# Patient Record
Sex: Male | Born: 1955 | Race: White | Hispanic: No | Marital: Single | Smoking: Never smoker
Health system: Southern US, Community
[De-identification: ages and names within clinical notes are randomized; demographics above are authoritative.]

## PROBLEM LIST (undated history)

## (undated) DIAGNOSIS — I10 Essential (primary) hypertension: Secondary | ICD-10-CM

## (undated) DIAGNOSIS — I499 Cardiac arrhythmia, unspecified: Secondary | ICD-10-CM

## (undated) DIAGNOSIS — I6502 Occlusion and stenosis of left vertebral artery: Secondary | ICD-10-CM

## (undated) DIAGNOSIS — I495 Sick sinus syndrome: Secondary | ICD-10-CM

## (undated) DIAGNOSIS — I219 Acute myocardial infarction, unspecified: Secondary | ICD-10-CM

## (undated) DIAGNOSIS — E785 Hyperlipidemia, unspecified: Secondary | ICD-10-CM

## (undated) DIAGNOSIS — M1990 Unspecified osteoarthritis, unspecified site: Secondary | ICD-10-CM

## (undated) DIAGNOSIS — I209 Angina pectoris, unspecified: Secondary | ICD-10-CM

## (undated) DIAGNOSIS — I4891 Unspecified atrial fibrillation: Secondary | ICD-10-CM

## (undated) DIAGNOSIS — I491 Atrial premature depolarization: Secondary | ICD-10-CM

## (undated) DIAGNOSIS — I2510 Atherosclerotic heart disease of native coronary artery without angina pectoris: Secondary | ICD-10-CM

## (undated) DIAGNOSIS — J189 Pneumonia, unspecified organism: Secondary | ICD-10-CM

## (undated) HISTORY — PX: ANGIOPLASTY: SHX39

## (undated) HISTORY — PX: CORONARY ANGIOPLASTY: SHX604

## (undated) HISTORY — PX: CARDIAC CATHETERIZATION: SHX172

## (undated) HISTORY — PX: HERNIA REPAIR: SHX51

---

## 1977-05-23 HISTORY — PX: KNEE ARTHROSCOPY: SUR90

## 2008-07-23 ENCOUNTER — Ambulatory Visit: Payer: Self-pay | Admitting: Family Medicine

## 2008-07-29 ENCOUNTER — Ambulatory Visit: Payer: Self-pay | Admitting: Family Medicine

## 2008-08-06 ENCOUNTER — Ambulatory Visit: Payer: Self-pay | Admitting: Family Medicine

## 2008-08-13 ENCOUNTER — Ambulatory Visit: Payer: Self-pay | Admitting: Family Medicine

## 2008-08-25 ENCOUNTER — Ambulatory Visit: Payer: Self-pay | Admitting: Specialist

## 2009-05-23 DIAGNOSIS — I219 Acute myocardial infarction, unspecified: Secondary | ICD-10-CM

## 2009-05-23 HISTORY — DX: Acute myocardial infarction, unspecified: I21.9

## 2009-07-30 IMAGING — CR DG CHEST 2V
1 series · 2 of 2 positions shown · non-contrast
Comparison: none

REASON FOR EXAM: sob, abnoral cxr cough
COMMENTS:

PROCEDURE:     MDR - MDR CHEST PA(OR AP) AND LATERAL  - August 25, 2008  [DATE]
RESULT:     Comparison: 08/13/2008

[Series 1: view not recorded · 0.17mm/px · 2 of 2 slices shown]
[im 1/2]
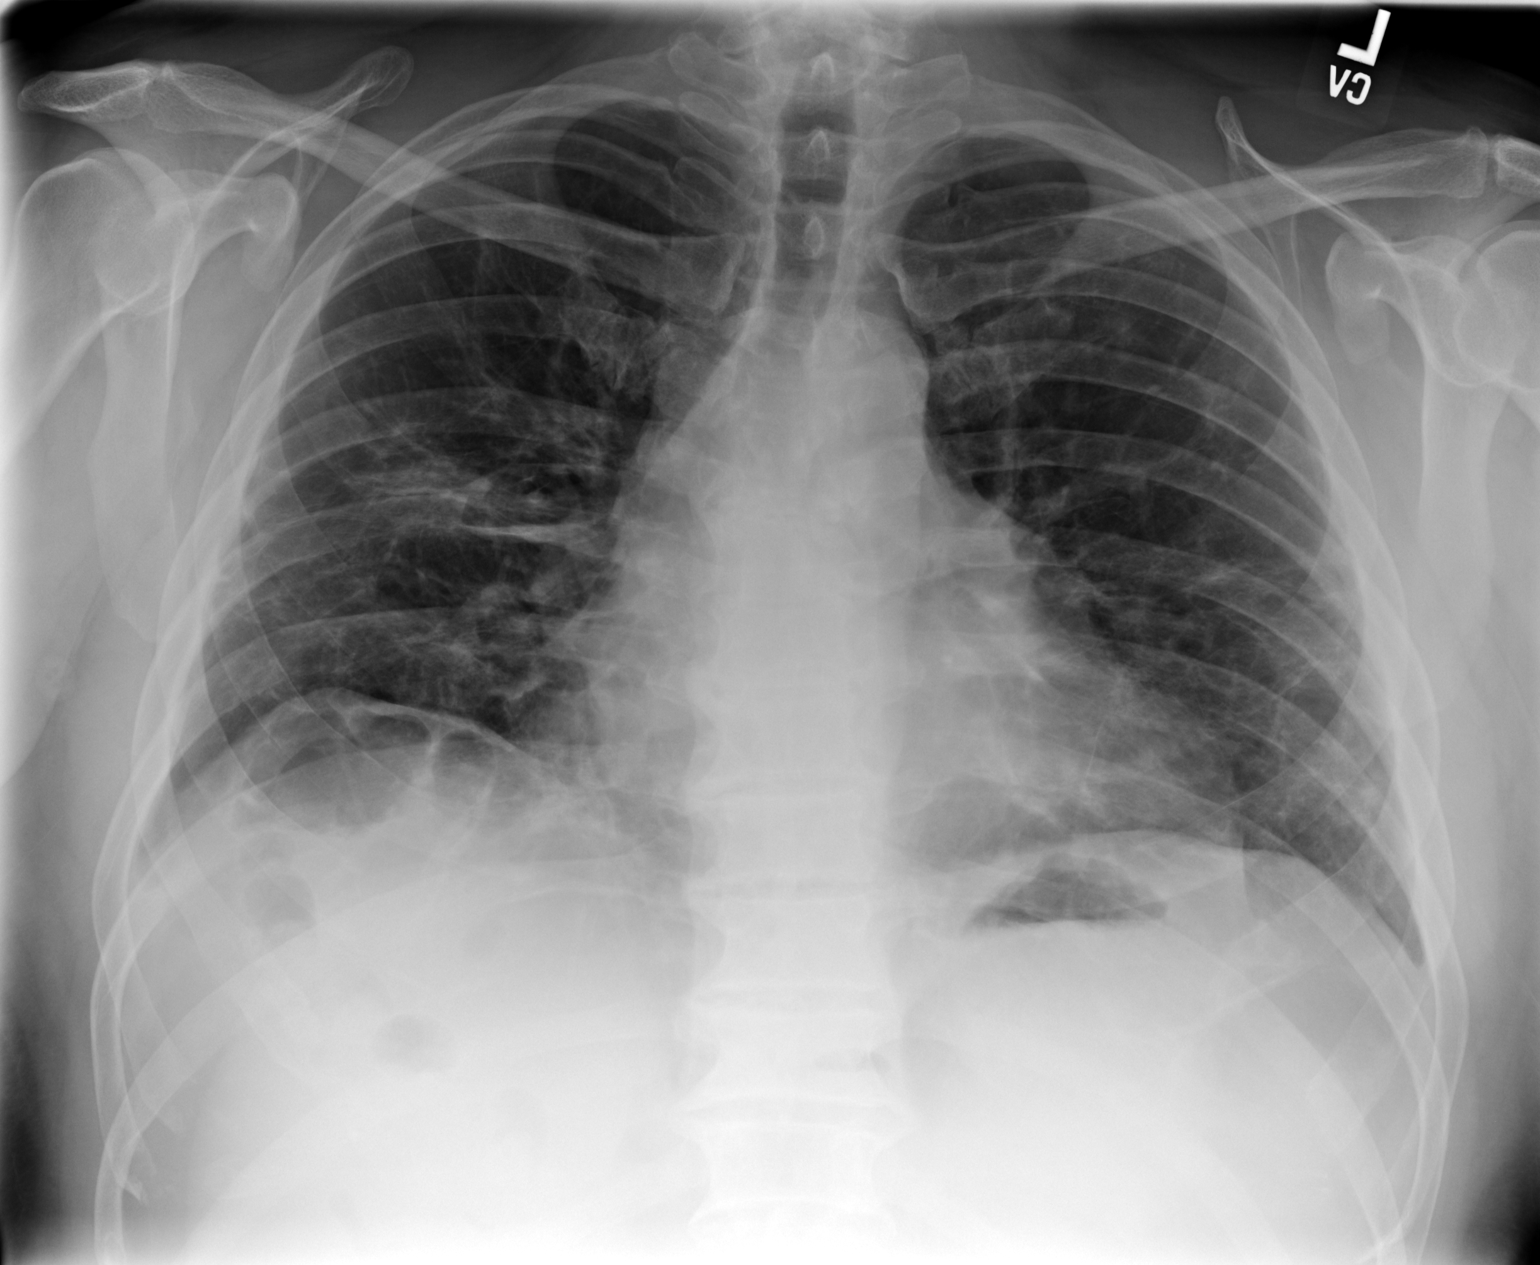
[im 2/2]
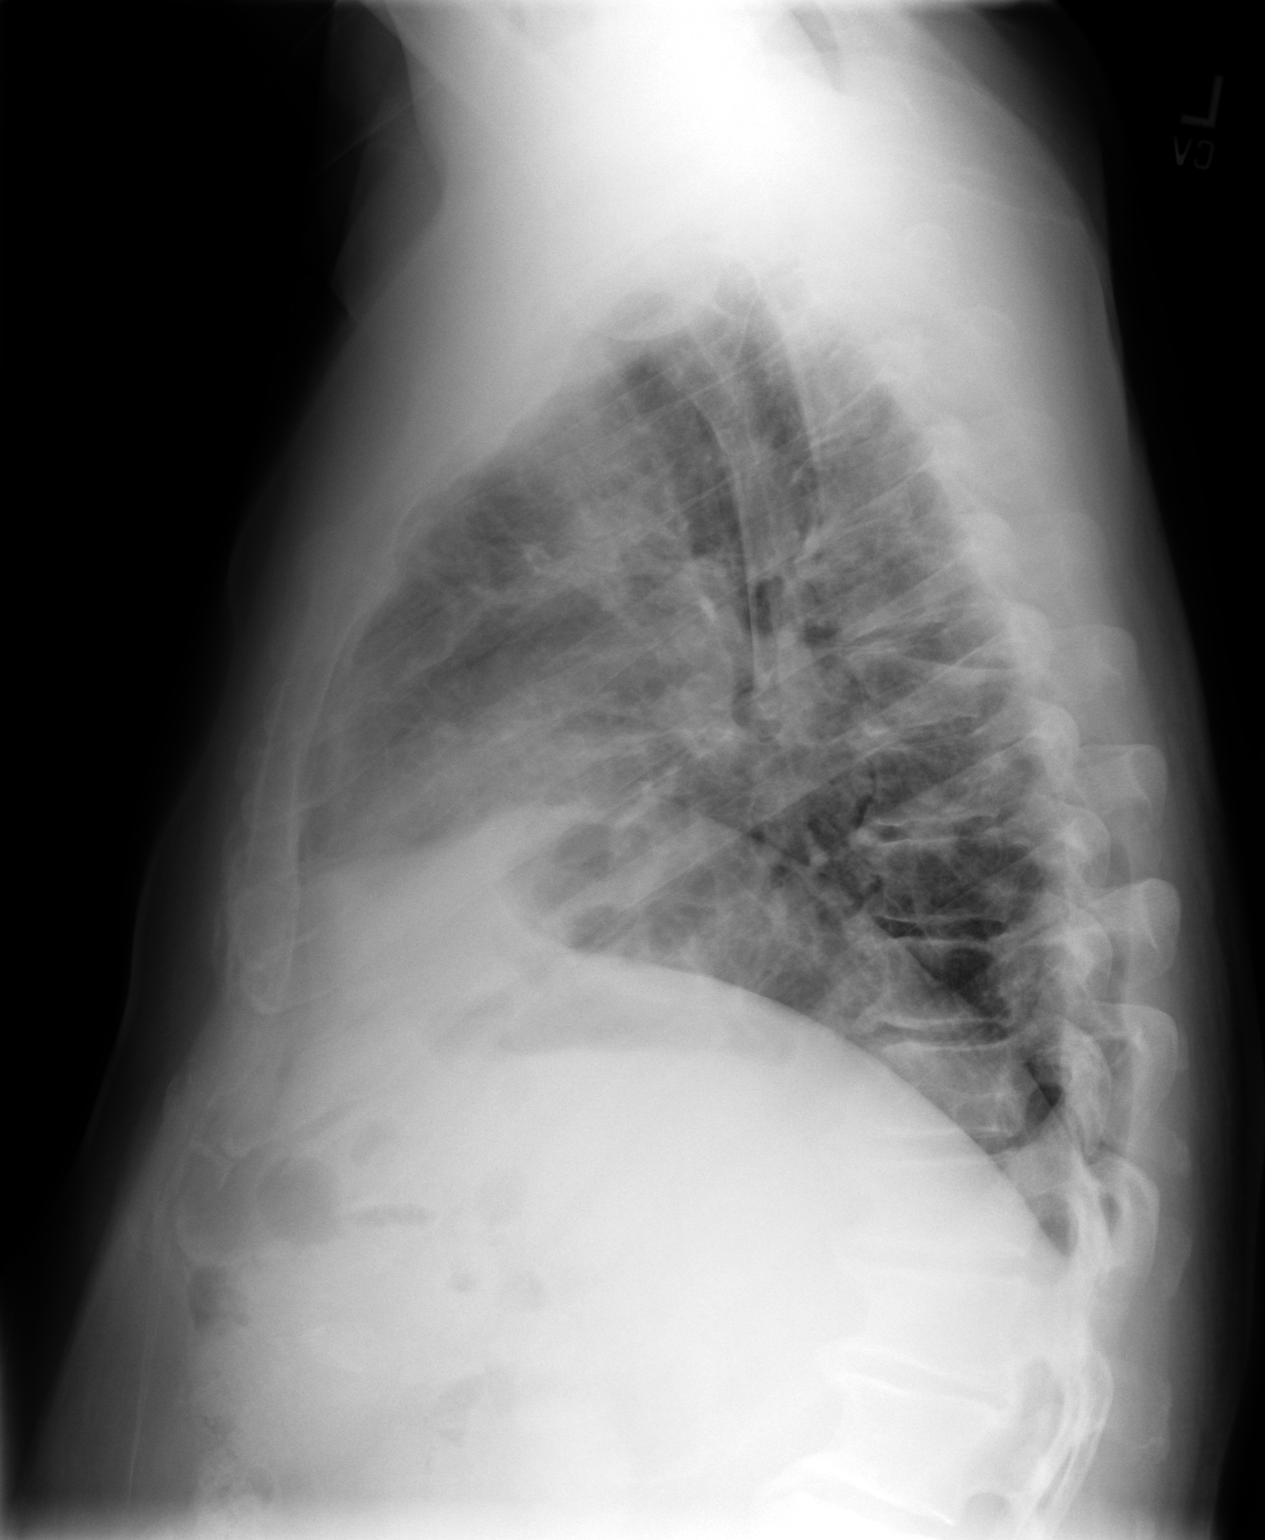

[2 of 2 positions shown; findings below may reference images not displayed]

FINDINGS: PA and lateral chest radiographs are provided. There are persistent patchy
densities seen bilaterally which appear mildly improved compared with the
prior examination which may resent resolving infiltrates versus atelectasis.
There is no pleural effusion or pneumothorax. The heart and mediastinum are
unremarkable. The osseous structures are unremarkable.
IMPRESSION: No acute disease of the chest.

## 2010-05-10 ENCOUNTER — Ambulatory Visit: Payer: Self-pay | Admitting: Internal Medicine

## 2011-09-07 ENCOUNTER — Ambulatory Visit: Payer: Self-pay | Admitting: Internal Medicine

## 2011-09-07 LAB — AMYLASE: Amylase: 50 U/L (ref 25–115)

## 2011-09-07 LAB — CBC WITH DIFFERENTIAL/PLATELET
Basophil #: 0 10*3/uL (ref 0.0–0.1)
Basophil %: 0.5 %
HCT: 51.2 % (ref 40.0–52.0)
HGB: 17.6 g/dL (ref 13.0–18.0)
Lymphocyte #: 1.6 10*3/uL (ref 1.0–3.6)
MCH: 31.3 pg (ref 26.0–34.0)
Monocyte %: 7.1 %
Neutrophil #: 6.4 10*3/uL (ref 1.4–6.5)
Neutrophil %: 73.1 %
Platelet: 223 10*3/uL (ref 150–440)
RDW: 13.7 % (ref 11.5–14.5)
WBC: 8.7 10*3/uL (ref 3.8–10.6)

## 2011-09-07 LAB — COMPREHENSIVE METABOLIC PANEL
Albumin: 4.3 g/dL (ref 3.4–5.0)
Anion Gap: 7 (ref 7–16)
BUN: 11 mg/dL (ref 7–18)
Bilirubin,Total: 0.7 mg/dL (ref 0.2–1.0)
Calcium, Total: 9.6 mg/dL (ref 8.5–10.1)
Co2: 32 mmol/L (ref 21–32)
Creatinine: 1.25 mg/dL (ref 0.60–1.30)
EGFR (Non-African Amer.): >60
Sodium: 140 mmol/L (ref 136–145)

## 2011-09-07 LAB — URINALYSIS, COMPLETE
Bacteria: NEGATIVE
Ketone: NEGATIVE
Leukocyte Esterase: NEGATIVE
Nitrite: NEGATIVE
Ph: 5 (ref 4.5–8.0)
Specific Gravity: 1.005 (ref 1.003–1.030)

## 2011-09-13 ENCOUNTER — Ambulatory Visit: Payer: Self-pay | Admitting: Internal Medicine

## 2012-04-24 ENCOUNTER — Ambulatory Visit: Payer: Self-pay

## 2015-07-07 DIAGNOSIS — R55 Syncope and collapse: Secondary | ICD-10-CM | POA: Diagnosis not present

## 2015-07-07 DIAGNOSIS — I2510 Atherosclerotic heart disease of native coronary artery without angina pectoris: Secondary | ICD-10-CM | POA: Diagnosis not present

## 2015-07-07 DIAGNOSIS — E782 Mixed hyperlipidemia: Secondary | ICD-10-CM | POA: Diagnosis not present

## 2015-07-07 DIAGNOSIS — R001 Bradycardia, unspecified: Secondary | ICD-10-CM | POA: Diagnosis not present

## 2015-07-13 DIAGNOSIS — D294 Benign neoplasm of scrotum: Secondary | ICD-10-CM | POA: Diagnosis not present

## 2015-07-19 DIAGNOSIS — R001 Bradycardia, unspecified: Secondary | ICD-10-CM | POA: Diagnosis not present

## 2015-07-19 DIAGNOSIS — R55 Syncope and collapse: Secondary | ICD-10-CM | POA: Diagnosis not present

## 2015-07-20 DIAGNOSIS — R55 Syncope and collapse: Secondary | ICD-10-CM | POA: Diagnosis not present

## 2015-07-21 DIAGNOSIS — S76312A Strain of muscle, fascia and tendon of the posterior muscle group at thigh level, left thigh, initial encounter: Secondary | ICD-10-CM | POA: Diagnosis not present

## 2015-08-10 DIAGNOSIS — E782 Mixed hyperlipidemia: Secondary | ICD-10-CM | POA: Diagnosis not present

## 2015-08-10 DIAGNOSIS — I2510 Atherosclerotic heart disease of native coronary artery without angina pectoris: Secondary | ICD-10-CM | POA: Diagnosis not present

## 2015-08-10 DIAGNOSIS — Z79899 Other long term (current) drug therapy: Secondary | ICD-10-CM | POA: Diagnosis not present

## 2015-08-25 DIAGNOSIS — R05 Cough: Secondary | ICD-10-CM | POA: Diagnosis not present

## 2015-08-25 DIAGNOSIS — R0982 Postnasal drip: Secondary | ICD-10-CM | POA: Diagnosis not present

## 2015-09-15 DIAGNOSIS — I491 Atrial premature depolarization: Secondary | ICD-10-CM | POA: Diagnosis not present

## 2015-09-15 DIAGNOSIS — I2510 Atherosclerotic heart disease of native coronary artery without angina pectoris: Secondary | ICD-10-CM | POA: Diagnosis not present

## 2015-09-15 DIAGNOSIS — E782 Mixed hyperlipidemia: Secondary | ICD-10-CM | POA: Diagnosis not present

## 2015-12-29 DIAGNOSIS — I209 Angina pectoris, unspecified: Secondary | ICD-10-CM | POA: Diagnosis not present

## 2015-12-29 DIAGNOSIS — I25119 Atherosclerotic heart disease of native coronary artery with unspecified angina pectoris: Secondary | ICD-10-CM | POA: Diagnosis not present

## 2016-01-04 DIAGNOSIS — I252 Old myocardial infarction: Secondary | ICD-10-CM | POA: Diagnosis not present

## 2016-01-04 DIAGNOSIS — E785 Hyperlipidemia, unspecified: Secondary | ICD-10-CM | POA: Diagnosis not present

## 2016-01-04 DIAGNOSIS — I10 Essential (primary) hypertension: Secondary | ICD-10-CM | POA: Diagnosis not present

## 2016-01-04 DIAGNOSIS — I2510 Atherosclerotic heart disease of native coronary artery without angina pectoris: Secondary | ICD-10-CM | POA: Diagnosis not present

## 2016-01-04 DIAGNOSIS — Z955 Presence of coronary angioplasty implant and graft: Secondary | ICD-10-CM | POA: Diagnosis not present

## 2016-01-04 DIAGNOSIS — R072 Precordial pain: Secondary | ICD-10-CM | POA: Diagnosis not present

## 2016-01-26 DIAGNOSIS — I2510 Atherosclerotic heart disease of native coronary artery without angina pectoris: Secondary | ICD-10-CM | POA: Diagnosis not present

## 2016-01-26 DIAGNOSIS — E782 Mixed hyperlipidemia: Secondary | ICD-10-CM | POA: Diagnosis not present

## 2016-02-10 DIAGNOSIS — Z79899 Other long term (current) drug therapy: Secondary | ICD-10-CM | POA: Diagnosis not present

## 2016-02-10 DIAGNOSIS — E782 Mixed hyperlipidemia: Secondary | ICD-10-CM | POA: Diagnosis not present

## 2016-02-10 DIAGNOSIS — Z0000 Encounter for general adult medical examination without abnormal findings: Secondary | ICD-10-CM | POA: Diagnosis not present

## 2016-02-10 DIAGNOSIS — I2510 Atherosclerotic heart disease of native coronary artery without angina pectoris: Secondary | ICD-10-CM | POA: Diagnosis not present

## 2016-02-10 DIAGNOSIS — Z1159 Encounter for screening for other viral diseases: Secondary | ICD-10-CM | POA: Diagnosis not present

## 2016-02-10 DIAGNOSIS — Z23 Encounter for immunization: Secondary | ICD-10-CM | POA: Diagnosis not present

## 2016-02-10 DIAGNOSIS — S39012D Strain of muscle, fascia and tendon of lower back, subsequent encounter: Secondary | ICD-10-CM | POA: Diagnosis not present

## 2016-02-10 DIAGNOSIS — Z125 Encounter for screening for malignant neoplasm of prostate: Secondary | ICD-10-CM | POA: Diagnosis not present

## 2016-02-10 DIAGNOSIS — L03211 Cellulitis of face: Secondary | ICD-10-CM | POA: Diagnosis not present

## 2016-04-03 DIAGNOSIS — S0990XA Unspecified injury of head, initial encounter: Secondary | ICD-10-CM | POA: Diagnosis not present

## 2016-04-03 DIAGNOSIS — Z23 Encounter for immunization: Secondary | ICD-10-CM | POA: Diagnosis not present

## 2016-04-03 DIAGNOSIS — R42 Dizziness and giddiness: Secondary | ICD-10-CM | POA: Diagnosis not present

## 2016-04-03 DIAGNOSIS — R55 Syncope and collapse: Secondary | ICD-10-CM | POA: Diagnosis not present

## 2016-04-03 DIAGNOSIS — S0181XA Laceration without foreign body of other part of head, initial encounter: Secondary | ICD-10-CM | POA: Diagnosis not present

## 2016-04-03 DIAGNOSIS — Z5181 Encounter for therapeutic drug level monitoring: Secondary | ICD-10-CM | POA: Diagnosis not present

## 2016-04-03 DIAGNOSIS — W01198A Fall on same level from slipping, tripping and stumbling with subsequent striking against other object, initial encounter: Secondary | ICD-10-CM | POA: Diagnosis not present

## 2016-04-03 DIAGNOSIS — S0101XA Laceration without foreign body of scalp, initial encounter: Secondary | ICD-10-CM | POA: Diagnosis not present

## 2016-04-03 DIAGNOSIS — I491 Atrial premature depolarization: Secondary | ICD-10-CM | POA: Diagnosis not present

## 2016-04-03 DIAGNOSIS — Z87891 Personal history of nicotine dependence: Secondary | ICD-10-CM | POA: Diagnosis not present

## 2016-04-03 DIAGNOSIS — R918 Other nonspecific abnormal finding of lung field: Secondary | ICD-10-CM | POA: Diagnosis not present

## 2016-04-03 DIAGNOSIS — R9431 Abnormal electrocardiogram [ECG] [EKG]: Secondary | ICD-10-CM | POA: Diagnosis not present

## 2016-04-03 DIAGNOSIS — W19XXXA Unspecified fall, initial encounter: Secondary | ICD-10-CM | POA: Diagnosis not present

## 2016-04-03 DIAGNOSIS — Z7901 Long term (current) use of anticoagulants: Secondary | ICD-10-CM | POA: Diagnosis not present

## 2016-04-03 DIAGNOSIS — Z7902 Long term (current) use of antithrombotics/antiplatelets: Secondary | ICD-10-CM | POA: Diagnosis not present

## 2016-04-03 DIAGNOSIS — S199XXA Unspecified injury of neck, initial encounter: Secondary | ICD-10-CM | POA: Diagnosis not present

## 2016-04-03 DIAGNOSIS — K089 Disorder of teeth and supporting structures, unspecified: Secondary | ICD-10-CM | POA: Diagnosis not present

## 2016-04-04 DIAGNOSIS — R55 Syncope and collapse: Secondary | ICD-10-CM | POA: Diagnosis not present

## 2016-04-12 DIAGNOSIS — I471 Supraventricular tachycardia: Secondary | ICD-10-CM | POA: Diagnosis not present

## 2016-04-12 DIAGNOSIS — R55 Syncope and collapse: Secondary | ICD-10-CM | POA: Diagnosis not present

## 2016-04-13 DIAGNOSIS — R2689 Other abnormalities of gait and mobility: Secondary | ICD-10-CM | POA: Diagnosis not present

## 2016-04-13 DIAGNOSIS — Z4802 Encounter for removal of sutures: Secondary | ICD-10-CM | POA: Diagnosis not present

## 2016-07-04 DIAGNOSIS — M7989 Other specified soft tissue disorders: Secondary | ICD-10-CM | POA: Diagnosis not present

## 2016-07-04 DIAGNOSIS — M25571 Pain in right ankle and joints of right foot: Secondary | ICD-10-CM | POA: Diagnosis not present

## 2016-07-04 DIAGNOSIS — M25471 Effusion, right ankle: Secondary | ICD-10-CM | POA: Diagnosis not present

## 2016-08-15 DIAGNOSIS — E782 Mixed hyperlipidemia: Secondary | ICD-10-CM | POA: Diagnosis not present

## 2016-08-15 DIAGNOSIS — Z79899 Other long term (current) drug therapy: Secondary | ICD-10-CM | POA: Diagnosis not present

## 2016-08-15 DIAGNOSIS — I2510 Atherosclerotic heart disease of native coronary artery without angina pectoris: Secondary | ICD-10-CM | POA: Diagnosis not present

## 2016-09-22 DIAGNOSIS — I6502 Occlusion and stenosis of left vertebral artery: Secondary | ICD-10-CM | POA: Diagnosis not present

## 2016-09-22 DIAGNOSIS — I4891 Unspecified atrial fibrillation: Secondary | ICD-10-CM | POA: Diagnosis not present

## 2016-09-22 DIAGNOSIS — R55 Syncope and collapse: Secondary | ICD-10-CM | POA: Diagnosis not present

## 2016-09-29 DIAGNOSIS — I2510 Atherosclerotic heart disease of native coronary artery without angina pectoris: Secondary | ICD-10-CM | POA: Diagnosis not present

## 2016-09-29 DIAGNOSIS — I4891 Unspecified atrial fibrillation: Secondary | ICD-10-CM | POA: Diagnosis not present

## 2016-09-29 DIAGNOSIS — E782 Mixed hyperlipidemia: Secondary | ICD-10-CM | POA: Diagnosis not present

## 2016-10-13 DIAGNOSIS — I491 Atrial premature depolarization: Secondary | ICD-10-CM | POA: Diagnosis not present

## 2016-10-13 DIAGNOSIS — I4891 Unspecified atrial fibrillation: Secondary | ICD-10-CM | POA: Diagnosis not present

## 2016-10-19 ENCOUNTER — Ambulatory Visit
Admission: EM | Admit: 2016-10-19 | Discharge: 2016-10-19 | Disposition: A | Discharge status: Home / Self Care | Payer: PPO | Attending: Family Medicine | Admitting: Family Medicine

## 2016-10-19 DIAGNOSIS — S61213A Laceration without foreign body of left middle finger without damage to nail, initial encounter: Secondary | ICD-10-CM

## 2016-10-19 HISTORY — DX: Occlusion and stenosis of left vertebral artery: I65.02

## 2016-10-19 HISTORY — DX: Sick sinus syndrome: I49.5

## 2016-10-19 HISTORY — DX: Hyperlipidemia, unspecified: E78.5

## 2016-10-19 HISTORY — DX: Unspecified atrial fibrillation: I48.91

## 2016-10-19 HISTORY — DX: Angina pectoris, unspecified: I20.9

## 2016-10-19 HISTORY — DX: Atrial premature depolarization: I49.1

## 2016-10-19 MED ORDER — LIDOCAINE HCL (PF) 1 % IJ SOLN: 5.0000 mL | Freq: Once | INTRAMUSCULAR | Status: DC

## 2016-10-19 MED ORDER — ACETAMINOPHEN 325 MG PO TABS
650.0000 mg | ORAL_TABLET | Freq: Once | ORAL | Status: AC
  Administered 2016-10-19: 650 mg via ORAL

## 2016-10-19 MED ORDER — CEPHALEXIN 500 MG PO CAPS: 500.0000 mg | ORAL_CAPSULE | Freq: Four times a day (QID) | ORAL | 0 refills | Status: DC

## 2016-10-19 MED ORDER — MUPIROCIN 2 % EX OINT: TOPICAL_OINTMENT | CUTANEOUS | 0 refills | Status: DC

## 2016-10-19 NOTE — Discharge Instructions (Signed)
Take medication as prescribed. Keep clean and dry as discussed.   Return in 7-10 days for suture removal.   Follow up with your primary care physician this week as needed. Return to Urgent care for new or worsening concerns.

## 2016-10-19 NOTE — ED Provider Notes (Signed)
[facility] ____________________________________________  Time seen: Approximately 8:05 PM  I have reviewed the triage vital signs and the nursing notes.   HISTORY  Chief Complaint Laceration   HPI Stephen Erickson is a 61 y.o. male presents for evaluation of left third digit laceration. Patient reports approximately 1 hour prior to arrival he was working outside of his home. Patient states that he was using an electric hedge tremors and was trying to pull a plant, reports his hand slipped causing it to hit the head trimmer causing laceration. States no pain to hand at this time. States he contemplated not come in tonight, but came in due to the bleeding continued. Reports taking oral Eliquis. Denies decreased range of motion, paresthesias or other injury. Denies fall to the ground, head injury or loss of consciousness. Denies other injuries. Reports last tetanus immunization was in 2017. Reports otherwise feels well. Denies other complaints. Denies recent sickness. Denies recent antibiotic use.   [place]: PCP   Past Medical History:  Diagnosis Date  . Angina pectoris ([place])   . Atrial fibrillation ([place])   . Hyperlipidemia   . PAC (premature atrial contraction)   . Tachy-brady syndrome ([place])   . Vertebral artery occlusion, left     There are no active problems to display for this patient.   Past Surgical History:  Procedure Laterality Date  . ANGIOPLASTY    . CARDIAC CATHETERIZATION    . HERNIA REPAIR    . KNEE ARTHROSCOPY Left 1979      Current Facility-Administered Medications:  .  lidocaine (PF) (XYLOCAINE) 1 % injection 5 mL, 5 mL, Other, Once, Marylene Land, NP  Current Outpatient Prescriptions:  .  apixaban (ELIQUIS) 5 MG TABS tablet, Take 5 mg by mouth 2 (two) times daily., Disp: , Rfl:  .  aspirin 81 MG chewable tablet, Chew by mouth daily., Disp: , Rfl:  .  atorvastatin (LIPITOR) 80 MG tablet, Take 80 mg by mouth daily.,  Disp: , Rfl:  .  diltiazem (CARDIZEM LA) 120 MG 24 hr tablet, Take 120 mg by mouth daily., Disp: , Rfl:  .  metoprolol succinate (TOPROL-XL) 100 MG 24 hr tablet, Take 100 mg by mouth daily. Take with or immediately following a meal., Disp: , Rfl:  .  cephALEXin (KEFLEX) 500 MG capsule, Take 1 capsule (500 mg total) by mouth 4 (four) times daily., Disp: 20 capsule, Rfl: 0 .  mupirocin ointment (BACTROBAN) 2 %, Apply three times a day for 7 days., Disp: 22 g, Rfl: 0  Allergies Lisinopril  No family history on file.  Social History Social History  Substance Use Topics  . Smoking status: Never Smoker  . Smokeless tobacco: Former Systems developer  . Alcohol use No    Review of Systems Constitutional: No fever/chills Cardiovascular: Denies chest pain. Respiratory: Denies shortness of breath. Musculoskeletal: Negative for back pain. Skin: As above.   ____________________________________________   PHYSICAL EXAM:  VITAL SIGNS: ED Triage Vitals  Enc Vitals Group     BP 10/19/16 1941 (!) 156/78     Pulse Rate 10/19/16 1941 60     Resp 10/19/16 1941 16     Temp 10/19/16 1941 98 F (36.7 C)     Temp Source 10/19/16 1941 Oral     SpO2 10/19/16 1941 97 %     Weight 10/19/16 1938 192 lb (87.1 kg)     Height 10/19/16 1938 5\' 6"  (1.676 m)     Head Circumference --  Peak Flow --      Pain Score 10/19/16 1938 0     Pain Loc --      Pain Edu? --      Excl. in [place]? --     Constitutional: Alert and oriented. Well appearing and in no acute distress. Cardiovascular: Normal rate, regular rhythm. Grossly normal heart sounds.  Good peripheral circulation. Respiratory: Normal respiratory effort without tachypnea nor retractions. Breath sounds are clear and equal bilaterally. No wheezes, rales, rhonchi. Musculoskeletal: Ambulatory with steady gait. see skin below.  Neurologic:  Normal speech and language. Speech is normal. No gait instability.  Skin:  Skin is warm, dry. Except: Left third finger  lateral aspect of distal phalanx 3 cm flap laceration present, no nail involvement, mild active bleeding, no foreign body visualized, no tendon or bone visualize, left third digit minimally tenderness to laceration site, no point bony tenderness, full range of motion present, left third digit with normal distal capillary refill and sensation, left third digit with full resisted flexion and extension with good range of motion, no motor or tendon deficit noted, left hand otherwise nontender. Psychiatric: Mood and affect are normal. Speech and behavior are normal. Patient exhibits appropriate insight and judgment   ___________________________________________   LABS (all labs ordered are listed, but only abnormal results are displayed)  Labs Reviewed - No data to display ____________________________________________  RADIOLOGY  No results found.   Patient declined ____________________________________________   PROCEDURES Procedures   Procedure(s) performed:  Procedure explained and verbal consent obtained. Consent: Verbal consent obtained. Written consent not obtained. Risks and benefits: risks, benefits and alternatives were discussed Patient identity confirmed: verbally with patient and hospital-assigned identification number  Consent given by: patient   Laceration Repair Location: left 3 finger Length: 3cm Foreign bodies: no foreign bodies noted Tendon involvement: none Nerve involvement: none Preparation: Patient was prepped and draped in the usual sterile fashion. Anesthesia with 1% Lidocaine 3 mls Irrigation solution: saline and cleaned with betadine Irrigation method: jet lavage Amount of cleaning: copious Repaired with 4-0 nylon  Number of sutures: 5 Technique: simple interrupted  Approximation: loose Patient tolerate well. Wound well approximated post repair.  Antibiotic ointment and dressing applied.  Wound care instructions provided.  Observe for any signs of  infection or other problems.    ______________________________________   INITIAL IMPRESSION / ASSESSMENT AND PLAN / ED COURSE  Pertinent labs & imaging results that were available during my care of the patient were reviewed by me and considered in my medical decision making (see chart for details).  Very well-appearing patient. No acute distress. Presenting for left third digit laceration of obtained at just prior to arrival by a mechanical injury. Tetanus immunization is up-to-date. Counseled regarding obtain an x-ray to evaluate for any bony abnormality as well as foreign body, patient declines. Counseled regarding the x-ray, patient verbalized understanding of risks and declines x-ray. Wound copiously irrigated and repaired. Discussed patient with patient as dirty outdoor tool object recommend placing patient on oral Keflex and topical Bactroban. Encouraged monitoring closely. Discussed wound care. Discussed return to urgent care in 7-10 days for suture removal. Discussed sooner return parameters.Discussed indication, risks and benefits of medications with patient.  Discussed follow up with Primary care physician this week as needed. Discussed follow up and return parameters including no resolution or any worsening concerns. Patient verbalized understanding and agreed to plan.   ____________________________________________   FINAL CLINICAL IMPRESSION(S) / ED DIAGNOSES  Final diagnoses:  Laceration of left middle finger  without foreign body without damage to nail, initial encounter     Discharge Medication List as of 10/19/2016  8:30 PM    START taking these medications   Details  cephALEXin (KEFLEX) 500 MG capsule Take 1 capsule (500 mg total) by mouth 4 (four) times daily., Starting Wed 10/19/2016, Normal    mupirocin ointment (BACTROBAN) 2 % Apply three times a day for 7 days., Normal        Note: This dictation was prepared with Dragon dictation along with smaller phrase  technology. Any transcriptional errors that result from this process are unintentional.         Marylene Land, NP 10/19/16 2113

## 2016-10-19 NOTE — ED Triage Notes (Signed)
Patient complains of laceration to left middle finger around 630pm with a hedge trimmer.

## 2016-10-21 DIAGNOSIS — R55 Syncope and collapse: Secondary | ICD-10-CM | POA: Diagnosis not present

## 2016-10-26 ENCOUNTER — Ambulatory Visit
Admission: EM | Admit: 2016-10-26 | Discharge: 2016-10-26 | Disposition: A | Discharge status: Home / Self Care | Payer: PPO

## 2016-10-26 DIAGNOSIS — I480 Paroxysmal atrial fibrillation: Secondary | ICD-10-CM | POA: Diagnosis not present

## 2016-10-26 DIAGNOSIS — I471 Supraventricular tachycardia: Secondary | ICD-10-CM | POA: Diagnosis not present

## 2016-10-26 DIAGNOSIS — Z4802 Encounter for removal of sutures: Secondary | ICD-10-CM

## 2016-10-26 DIAGNOSIS — R55 Syncope and collapse: Secondary | ICD-10-CM | POA: Diagnosis not present

## 2016-10-26 NOTE — ED Triage Notes (Signed)
Here for suture removal from left middle finger. Site appears well healed.

## 2016-10-28 DIAGNOSIS — I2510 Atherosclerotic heart disease of native coronary artery without angina pectoris: Secondary | ICD-10-CM | POA: Diagnosis not present

## 2016-10-28 DIAGNOSIS — I6502 Occlusion and stenosis of left vertebral artery: Secondary | ICD-10-CM | POA: Diagnosis not present

## 2016-10-28 DIAGNOSIS — E782 Mixed hyperlipidemia: Secondary | ICD-10-CM | POA: Diagnosis not present

## 2016-10-28 DIAGNOSIS — I209 Angina pectoris, unspecified: Secondary | ICD-10-CM | POA: Diagnosis not present

## 2016-10-28 DIAGNOSIS — I472 Ventricular tachycardia: Secondary | ICD-10-CM | POA: Diagnosis not present

## 2016-10-28 DIAGNOSIS — I491 Atrial premature depolarization: Secondary | ICD-10-CM | POA: Diagnosis not present

## 2016-10-28 DIAGNOSIS — I480 Paroxysmal atrial fibrillation: Secondary | ICD-10-CM | POA: Diagnosis not present

## 2016-10-28 DIAGNOSIS — I495 Sick sinus syndrome: Secondary | ICD-10-CM | POA: Diagnosis not present

## 2016-10-28 DIAGNOSIS — R55 Syncope and collapse: Secondary | ICD-10-CM | POA: Diagnosis not present

## 2016-10-28 DIAGNOSIS — I214 Non-ST elevation (NSTEMI) myocardial infarction: Secondary | ICD-10-CM | POA: Diagnosis not present

## 2016-10-28 DIAGNOSIS — I471 Supraventricular tachycardia: Secondary | ICD-10-CM | POA: Diagnosis not present

## 2016-11-04 DIAGNOSIS — I471 Supraventricular tachycardia: Secondary | ICD-10-CM | POA: Diagnosis not present

## 2016-11-04 DIAGNOSIS — R55 Syncope and collapse: Secondary | ICD-10-CM | POA: Diagnosis not present

## 2016-11-15 DIAGNOSIS — Z5181 Encounter for therapeutic drug level monitoring: Secondary | ICD-10-CM | POA: Diagnosis not present

## 2016-11-15 DIAGNOSIS — Z79899 Other long term (current) drug therapy: Secondary | ICD-10-CM | POA: Diagnosis not present

## 2017-01-19 DIAGNOSIS — I480 Paroxysmal atrial fibrillation: Secondary | ICD-10-CM | POA: Diagnosis not present

## 2017-02-15 DIAGNOSIS — Z23 Encounter for immunization: Secondary | ICD-10-CM | POA: Diagnosis not present

## 2017-02-15 DIAGNOSIS — E669 Obesity, unspecified: Secondary | ICD-10-CM | POA: Diagnosis not present

## 2017-02-15 DIAGNOSIS — Z0000 Encounter for general adult medical examination without abnormal findings: Secondary | ICD-10-CM | POA: Diagnosis not present

## 2017-02-15 DIAGNOSIS — I2510 Atherosclerotic heart disease of native coronary artery without angina pectoris: Secondary | ICD-10-CM | POA: Diagnosis not present

## 2017-02-15 DIAGNOSIS — E782 Mixed hyperlipidemia: Secondary | ICD-10-CM | POA: Diagnosis not present

## 2017-02-15 DIAGNOSIS — Z79899 Other long term (current) drug therapy: Secondary | ICD-10-CM | POA: Diagnosis not present

## 2017-02-24 DIAGNOSIS — I471 Supraventricular tachycardia: Secondary | ICD-10-CM | POA: Diagnosis not present

## 2017-02-24 DIAGNOSIS — I2510 Atherosclerotic heart disease of native coronary artery without angina pectoris: Secondary | ICD-10-CM | POA: Diagnosis not present

## 2017-02-24 DIAGNOSIS — E782 Mixed hyperlipidemia: Secondary | ICD-10-CM | POA: Diagnosis not present

## 2017-03-09 DIAGNOSIS — M170 Bilateral primary osteoarthritis of knee: Secondary | ICD-10-CM | POA: Diagnosis not present

## 2017-03-09 DIAGNOSIS — M25561 Pain in right knee: Secondary | ICD-10-CM | POA: Diagnosis not present

## 2017-03-09 DIAGNOSIS — M25562 Pain in left knee: Secondary | ICD-10-CM | POA: Diagnosis not present

## 2017-07-20 DIAGNOSIS — I480 Paroxysmal atrial fibrillation: Secondary | ICD-10-CM | POA: Diagnosis not present

## 2017-08-17 DIAGNOSIS — E669 Obesity, unspecified: Secondary | ICD-10-CM | POA: Diagnosis not present

## 2017-08-17 DIAGNOSIS — Z0000 Encounter for general adult medical examination without abnormal findings: Secondary | ICD-10-CM | POA: Diagnosis not present

## 2017-08-17 DIAGNOSIS — E782 Mixed hyperlipidemia: Secondary | ICD-10-CM | POA: Diagnosis not present

## 2017-08-17 DIAGNOSIS — Z79899 Other long term (current) drug therapy: Secondary | ICD-10-CM | POA: Diagnosis not present

## 2017-08-17 DIAGNOSIS — M170 Bilateral primary osteoarthritis of knee: Secondary | ICD-10-CM | POA: Diagnosis not present

## 2017-08-17 DIAGNOSIS — I2510 Atherosclerotic heart disease of native coronary artery without angina pectoris: Secondary | ICD-10-CM | POA: Diagnosis not present

## 2017-08-22 DIAGNOSIS — M25562 Pain in left knee: Secondary | ICD-10-CM | POA: Diagnosis not present

## 2017-08-22 DIAGNOSIS — M25561 Pain in right knee: Secondary | ICD-10-CM | POA: Diagnosis not present

## 2017-08-22 DIAGNOSIS — M170 Bilateral primary osteoarthritis of knee: Secondary | ICD-10-CM | POA: Diagnosis not present

## 2017-09-19 DIAGNOSIS — E782 Mixed hyperlipidemia: Secondary | ICD-10-CM | POA: Diagnosis not present

## 2017-09-19 DIAGNOSIS — I2510 Atherosclerotic heart disease of native coronary artery without angina pectoris: Secondary | ICD-10-CM | POA: Diagnosis not present

## 2017-09-19 DIAGNOSIS — I471 Supraventricular tachycardia: Secondary | ICD-10-CM | POA: Diagnosis not present

## 2017-09-19 DIAGNOSIS — I480 Paroxysmal atrial fibrillation: Secondary | ICD-10-CM | POA: Diagnosis not present

## 2017-09-26 DIAGNOSIS — M170 Bilateral primary osteoarthritis of knee: Secondary | ICD-10-CM | POA: Diagnosis not present

## 2017-10-04 DIAGNOSIS — M1732 Unilateral post-traumatic osteoarthritis, left knee: Secondary | ICD-10-CM | POA: Diagnosis not present

## 2017-10-18 ENCOUNTER — Encounter
Admission: RE | Admit: 2017-10-18 | Discharge: 2017-10-18 | Disposition: A | Discharge status: Home / Self Care | Payer: PPO | Source: Ambulatory Visit | Attending: Surgery | Admitting: Surgery

## 2017-10-18 ENCOUNTER — Other Ambulatory Visit: Payer: Self-pay

## 2017-10-18 ENCOUNTER — Ambulatory Visit
Admission: RE | Admit: 2017-10-18 | Discharge: 2017-10-18 | Disposition: A | Discharge status: Home / Self Care | Payer: PPO | Source: Ambulatory Visit | Attending: Surgery | Admitting: Surgery

## 2017-10-18 DIAGNOSIS — Z01811 Encounter for preprocedural respiratory examination: Secondary | ICD-10-CM | POA: Diagnosis not present

## 2017-10-18 DIAGNOSIS — Z01810 Encounter for preprocedural cardiovascular examination: Secondary | ICD-10-CM | POA: Diagnosis not present

## 2017-10-18 DIAGNOSIS — R001 Bradycardia, unspecified: Secondary | ICD-10-CM | POA: Insufficient documentation

## 2017-10-18 DIAGNOSIS — M170 Bilateral primary osteoarthritis of knee: Secondary | ICD-10-CM | POA: Insufficient documentation

## 2017-10-18 DIAGNOSIS — J984 Other disorders of lung: Secondary | ICD-10-CM | POA: Diagnosis not present

## 2017-10-18 HISTORY — DX: Acute myocardial infarction, unspecified: I21.9

## 2017-10-18 LAB — URINALYSIS, ROUTINE W REFLEX MICROSCOPIC
Bilirubin Urine: NEGATIVE
GLUCOSE, UA: NEGATIVE mg/dL
HGB URINE DIPSTICK: NEGATIVE
Ketones, ur: NEGATIVE mg/dL
Leukocytes, UA: NEGATIVE
Nitrite: NEGATIVE
PROTEIN: NEGATIVE mg/dL
SPECIFIC GRAVITY, URINE: 1.018 (ref 1.005–1.030)
pH: 5 (ref 5.0–8.0)

## 2017-10-18 LAB — BASIC METABOLIC PANEL
Anion gap: 7 (ref 5–15)
BUN: 12 mg/dL (ref 6–20)
CO2: 27 mmol/L (ref 22–32)
CREATININE: 1.06 mg/dL (ref 0.61–1.24)
Calcium: 9.3 mg/dL (ref 8.9–10.3)
Chloride: 104 mmol/L (ref 101–111)
GFR calc non Af Amer: >60 mL/min (ref >60)
GLUCOSE: 84 mg/dL (ref 65–99)
Potassium: 3.7 mmol/L (ref 3.5–5.1)
Sodium: 138 mmol/L (ref 135–145)

## 2017-10-18 LAB — PROTIME-INR
INR: 0.93
Prothrombin Time: 12.4 seconds (ref 11.4–15.2)

## 2017-10-18 LAB — CBC
HEMATOCRIT: 51.9 % (ref 40.0–52.0)
Hemoglobin: 17.4 g/dL (ref 13.0–18.0)
MCH: 31.2 pg (ref 26.0–34.0)
MCHC: 33.6 g/dL (ref 32.0–36.0)
MCV: 92.8 fL (ref 80.0–100.0)
Platelets: 184 10*3/uL (ref 150–440)
RBC: 5.59 MIL/uL (ref 4.40–5.90)
RDW: 14.3 % (ref 11.5–14.5)
WBC: 5.6 10*3/uL (ref 3.8–10.6)

## 2017-10-18 LAB — SURGICAL PCR SCREEN
MRSA, PCR: NEGATIVE
Staphylococcus aureus: NEGATIVE

## 2017-10-18 LAB — TYPE AND SCREEN
ABO/RH(D): B POS
ANTIBODY SCREEN: NEGATIVE

## 2017-10-18 NOTE — Patient Instructions (Signed)
Your procedure is scheduled on: Tuesday 10/31/17 Report to [facility]. To find out your arrival time please call [phone] between 1PM - 3PM on Monday 10/30/17.  Remember: Instructions that are not followed completely may result in serious medical risk, up to and including death, or upon the discretion of your surgeon and anesthesiologist your surgery may need to be rescheduled.     _X__ 1. Do not eat food after midnight the night before your procedure.                 No gum chewing or hard candies. You may drink clear liquids up to 2 hours                 before you are scheduled to arrive for your surgery- DO not drink clear                 liquids within 2 hours of the start of your surgery.                 Clear Liquids include:  water, apple juice without pulp, clear carbohydrate                 drink such as Clearfast or Gatorade, Black Coffee or Tea (Do not add                 anything to coffee or tea).  __X__2.  On the morning of surgery brush your teeth with toothpaste and water, you                 may rinse your mouth with mouthwash if you wish.  Do not swallow any              toothpaste of mouthwash.     _X__ 3.  No Alcohol for 24 hours before or after surgery.   _X__ 4.  Do Not Smoke or use e-cigarettes For 24 Hours Prior to Your Surgery.                 Do not use any chewable tobacco products for at least 6 hours prior to                 surgery.  ____  5.  Bring all medications with you on the day of surgery if instructed.   __X__  6.  Notify your doctor if there is any change in your medical condition      (cold, fever, infections).     Do not wear jewelry, make-up, hairpins, clips or nail polish. Do not wear lotions, powders, or perfumes.  Do not shave 48 hours prior to surgery. Men may shave face and neck. Do not bring valuables to the hospital.    [facility] is not responsible for any belongings or  valuables.  Contacts, dentures/partials or body piercings may not be worn into surgery. Bring a case for your contacts, glasses or hearing aids, a denture cup will be supplied. Leave your suitcase in the car. After surgery it may be brought to your room. For patients admitted to the hospital, discharge time is determined by your treatment team.   Patients discharged the day of surgery will not be allowed to drive home.   Please read over the following fact sheets that you were given:   MRSA Information  __X__ Take these medicines the morning of surgery with A SIP OF WATER:  1. METOPROLOL  2. SOTALOL  3.   4.  5.  6.  ____ Fleet Enema (as directed)   __X__ Use CHG Soap/SAGE wipes as directed  ____ Use inhalers on the day of surgery  ____ Stop metformin/Janumet/Farxiga 2 days prior to surgery    ____ Take 1/2 of usual insulin dose the night before surgery. No insulin the morning          of surgery.   __X__ Stop Blood Thinners Coumadin/Plavix/Xarelto/Pleta/Pradaxa/Eliquis/Effient/Aspirin  on   Or contact your Surgeon, Cardiologist or Medical Doctor regarding  ability to stop your blood thinners  __X__ Stop Anti-inflammatories 7 days before surgery such as Advil, Ibuprofen, Motrin,  BC or Goodies Powder, Naprosyn, Naproxen, Aleve, Aspirin    __X__ Stop all herbal supplements, fish oil or vitamin E until after surgery.    ____ Bring C-Pap to the hospital.   YOU NEED TO CONTACT YOUR DOCTOR ABOUT THE ELIQUIS PRESCRIPTION THEN CALL OR COME BACK AND LET us KNOW WHAT HE/SHE ADVISED. THEY NEED TO BE AWARE YOUR HAVING KNEE REPLACEMENT SURGERY ON 10/31/17 AND IF YOU CAN HOLD THE ELIQUIS AND OR ASPIRIN AND FOR HOW LONG.

## 2017-10-18 NOTE — Pre-Procedure Instructions (Signed)
Reviewed patient medical history and most recent cardiology note with Dr Amie Critchley. During interview patient stated he was not taking Eliquis as recommended by his cardiologist due to its cost when he went to pick it up at his pharmacy. Cardiology clearance requested, fax sent to cardiologist office and Tiffany at ortho notified.

## 2017-10-24 NOTE — Pre-Procedure Instructions (Signed)
SPOKE WITH Stephen Erickson AT DR ROE[facility] AND SHE IS TRACKING STATUS OF CLEARANCE.

## 2017-10-30 MED ORDER — CEFAZOLIN SODIUM-DEXTROSE 2-4 GM/100ML-% IV SOLN
2.0000 g | Freq: Once | INTRAVENOUS | Status: AC
  Administered 2017-10-31: 2 g via INTRAVENOUS

## 2017-10-31 ENCOUNTER — Encounter: Payer: Self-pay | Admitting: *Deleted

## 2017-10-31 ENCOUNTER — Encounter
Admission: RE | Disposition: A | Discharge status: Home Health | Payer: Self-pay | Source: Home / Self Care | Attending: Surgery

## 2017-10-31 ENCOUNTER — Other Ambulatory Visit: Payer: Self-pay

## 2017-10-31 ENCOUNTER — Inpatient Hospital Stay: Payer: PPO | Admitting: Anesthesiology

## 2017-10-31 ENCOUNTER — Inpatient Hospital Stay: Payer: PPO

## 2017-10-31 ENCOUNTER — Inpatient Hospital Stay
Admission: RE | Admit: 2017-10-31 | Discharge: 2017-11-02 | DRG: 470 | Disposition: A | Discharge status: Home Health | Payer: PPO | Attending: Surgery | Admitting: Surgery

## 2017-10-31 DIAGNOSIS — E785 Hyperlipidemia, unspecified: Secondary | ICD-10-CM | POA: Diagnosis not present

## 2017-10-31 DIAGNOSIS — I2510 Atherosclerotic heart disease of native coronary artery without angina pectoris: Secondary | ICD-10-CM | POA: Diagnosis not present

## 2017-10-31 DIAGNOSIS — I252 Old myocardial infarction: Secondary | ICD-10-CM

## 2017-10-31 DIAGNOSIS — Z471 Aftercare following joint replacement surgery: Secondary | ICD-10-CM | POA: Diagnosis not present

## 2017-10-31 DIAGNOSIS — Z8249 Family history of ischemic heart disease and other diseases of the circulatory system: Secondary | ICD-10-CM | POA: Diagnosis not present

## 2017-10-31 DIAGNOSIS — M1732 Unilateral post-traumatic osteoarthritis, left knee: Principal | ICD-10-CM | POA: Diagnosis present

## 2017-10-31 DIAGNOSIS — I10 Essential (primary) hypertension: Secondary | ICD-10-CM | POA: Diagnosis not present

## 2017-10-31 DIAGNOSIS — I739 Peripheral vascular disease, unspecified: Secondary | ICD-10-CM | POA: Diagnosis not present

## 2017-10-31 DIAGNOSIS — Z800 Family history of malignant neoplasm of digestive organs: Secondary | ICD-10-CM | POA: Diagnosis not present

## 2017-10-31 DIAGNOSIS — Z7982 Long term (current) use of aspirin: Secondary | ICD-10-CM

## 2017-10-31 DIAGNOSIS — Z955 Presence of coronary angioplasty implant and graft: Secondary | ICD-10-CM

## 2017-10-31 DIAGNOSIS — Z888 Allergy status to other drugs, medicaments and biological substances status: Secondary | ICD-10-CM | POA: Diagnosis not present

## 2017-10-31 DIAGNOSIS — Z807 Family history of other malignant neoplasms of lymphoid, hematopoietic and related tissues: Secondary | ICD-10-CM

## 2017-10-31 DIAGNOSIS — Z96652 Presence of left artificial knee joint: Secondary | ICD-10-CM

## 2017-10-31 DIAGNOSIS — Z7901 Long term (current) use of anticoagulants: Secondary | ICD-10-CM

## 2017-10-31 DIAGNOSIS — Z79899 Other long term (current) drug therapy: Secondary | ICD-10-CM

## 2017-10-31 DIAGNOSIS — I480 Paroxysmal atrial fibrillation: Secondary | ICD-10-CM | POA: Diagnosis not present

## 2017-10-31 HISTORY — PX: TOTAL KNEE ARTHROPLASTY: SHX125

## 2017-10-31 LAB — ABO/RH: ABO/RH(D): B POS

## 2017-10-31 SURGERY — ARTHROPLASTY, KNEE, TOTAL
Anesthesia: Spinal | Site: Knee | Laterality: Left | Wound class: Clean

## 2017-10-31 MED ORDER — GLYCOPYRROLATE 0.2 MG/ML IJ SOLN
INTRAMUSCULAR | Status: AC
  Filled 2017-10-31: qty 1

## 2017-10-31 MED ORDER — FENTANYL CITRATE (PF) 100 MCG/2ML IJ SOLN
INTRAMUSCULAR | Status: AC
  Filled 2017-10-31: qty 2

## 2017-10-31 MED ORDER — PROPOFOL 500 MG/50ML IV EMUL
INTRAVENOUS | Status: DC | PRN
  Administered 2017-10-31: 60 ug/kg/min via INTRAVENOUS

## 2017-10-31 MED ORDER — ASPIRIN 81 MG PO CHEW
81.0000 mg | CHEWABLE_TABLET | Freq: Every day | ORAL | Status: DC
  Administered 2017-10-31 – 2017-11-02 (×3): 81 mg via ORAL
  Filled 2017-10-31 (×3): qty 1

## 2017-10-31 MED ORDER — MIDAZOLAM HCL 2 MG/2ML IJ SOLN
INTRAMUSCULAR | Status: AC
  Filled 2017-10-31: qty 2

## 2017-10-31 MED ORDER — FLEET ENEMA 7-19 GM/118ML RE ENEM: 1.0000 | ENEMA | Freq: Once | RECTAL | Status: DC | PRN

## 2017-10-31 MED ORDER — KETOROLAC TROMETHAMINE 30 MG/ML IJ SOLN
INTRAMUSCULAR | Status: AC
Start: 2017-10-31 — End: 2017-10-31
  Administered 2017-10-31: 30 mg via INTRAVENOUS
  Filled 2017-10-31: qty 1

## 2017-10-31 MED ORDER — KETOROLAC TROMETHAMINE 15 MG/ML IJ SOLN
15.0000 mg | Freq: Four times a day (QID) | INTRAMUSCULAR | Status: AC
  Administered 2017-10-31 – 2017-11-01 (×4): 15 mg via INTRAVENOUS
  Filled 2017-10-31 (×4): qty 1

## 2017-10-31 MED ORDER — ACETAMINOPHEN 325 MG PO TABS: 325.0000 mg | ORAL_TABLET | Freq: Four times a day (QID) | ORAL | Status: DC | PRN

## 2017-10-31 MED ORDER — LIDOCAINE HCL (PF) 2 % IJ SOLN
INTRAMUSCULAR | Status: AC
  Filled 2017-10-31: qty 10

## 2017-10-31 MED ORDER — METOPROLOL SUCCINATE ER 25 MG PO TB24
25.0000 mg | ORAL_TABLET | Freq: Every day | ORAL | Status: DC
  Administered 2017-11-01 – 2017-11-02 (×2): 25 mg via ORAL
  Filled 2017-10-31 (×2): qty 1

## 2017-10-31 MED ORDER — PANTOPRAZOLE SODIUM 40 MG PO TBEC
40.0000 mg | DELAYED_RELEASE_TABLET | Freq: Every day | ORAL | Status: DC
  Administered 2017-10-31 – 2017-11-02 (×3): 40 mg via ORAL
  Filled 2017-10-31 (×3): qty 1

## 2017-10-31 MED ORDER — BUPIVACAINE-EPINEPHRINE (PF) 0.5% -1:200000 IJ SOLN
INTRAMUSCULAR | Status: AC
  Filled 2017-10-31: qty 30

## 2017-10-31 MED ORDER — TRAMADOL HCL 50 MG PO TABS
50.0000 mg | ORAL_TABLET | Freq: Four times a day (QID) | ORAL | Status: DC | PRN
  Administered 2017-11-01 (×2): 50 mg via ORAL
  Filled 2017-10-31 (×2): qty 1

## 2017-10-31 MED ORDER — PHENYLEPHRINE HCL 10 MG/ML IJ SOLN
INTRAMUSCULAR | Status: AC
  Filled 2017-10-31: qty 1

## 2017-10-31 MED ORDER — SODIUM CHLORIDE 0.9 % IV SOLN
INTRAVENOUS | Status: DC | PRN
  Administered 2017-10-31: 50 ug/min via INTRAVENOUS

## 2017-10-31 MED ORDER — ONDANSETRON HCL 4 MG/2ML IJ SOLN: 4.0000 mg | Freq: Four times a day (QID) | INTRAMUSCULAR | Status: DC | PRN

## 2017-10-31 MED ORDER — METOCLOPRAMIDE HCL 5 MG/ML IJ SOLN: 5.0000 mg | Freq: Three times a day (TID) | INTRAMUSCULAR | Status: DC | PRN

## 2017-10-31 MED ORDER — CEFAZOLIN SODIUM-DEXTROSE 2-4 GM/100ML-% IV SOLN
INTRAVENOUS | Status: AC
  Filled 2017-10-31: qty 100

## 2017-10-31 MED ORDER — BISACODYL 10 MG RE SUPP: 10.0000 mg | Freq: Every day | RECTAL | Status: DC | PRN

## 2017-10-31 MED ORDER — BUPIVACAINE LIPOSOME 1.3 % IJ SUSP
INTRAMUSCULAR | Status: AC
  Filled 2017-10-31: qty 20

## 2017-10-31 MED ORDER — LACTATED RINGERS IV SOLN
INTRAVENOUS | Status: DC
  Administered 2017-10-31 (×2): via INTRAVENOUS

## 2017-10-31 MED ORDER — DOCUSATE SODIUM 100 MG PO CAPS
100.0000 mg | ORAL_CAPSULE | Freq: Two times a day (BID) | ORAL | Status: DC
  Administered 2017-10-31 – 2017-11-02 (×5): 100 mg via ORAL
  Filled 2017-10-31 (×5): qty 1

## 2017-10-31 MED ORDER — HYDROMORPHONE HCL 1 MG/ML IJ SOLN
0.5000 mg | INTRAMUSCULAR | Status: DC | PRN
  Administered 2017-10-31: 1 mg via INTRAVENOUS
  Filled 2017-10-31: qty 1

## 2017-10-31 MED ORDER — MIDAZOLAM HCL 5 MG/5ML IJ SOLN
INTRAMUSCULAR | Status: DC | PRN
  Administered 2017-10-31: 1.5 mg via INTRAVENOUS

## 2017-10-31 MED ORDER — ATORVASTATIN CALCIUM 20 MG PO TABS
80.0000 mg | ORAL_TABLET | Freq: Every day | ORAL | Status: DC
  Administered 2017-10-31 – 2017-11-01 (×2): 80 mg via ORAL
  Filled 2017-10-31 (×2): qty 4

## 2017-10-31 MED ORDER — FENTANYL CITRATE (PF) 100 MCG/2ML IJ SOLN
INTRAMUSCULAR | Status: DC | PRN
  Administered 2017-10-31: 75 ug via INTRAVENOUS

## 2017-10-31 MED ORDER — ERYTHROMYCIN 5 MG/GM OP OINT
TOPICAL_OINTMENT | OPHTHALMIC | Status: AC
Start: 2017-10-31 — End: 2017-11-01
  Administered 2017-10-31 – 2017-11-01 (×4): 1 via OPHTHALMIC
  Filled 2017-10-31: qty 1
  Filled 2017-10-31 (×2): qty 3.5

## 2017-10-31 MED ORDER — PROPOFOL 500 MG/50ML IV EMUL
INTRAVENOUS | Status: AC
  Filled 2017-10-31: qty 50

## 2017-10-31 MED ORDER — SOTALOL HCL 80 MG PO TABS
80.0000 mg | ORAL_TABLET | Freq: Two times a day (BID) | ORAL | Status: DC
  Administered 2017-10-31 – 2017-11-02 (×4): 80 mg via ORAL
  Filled 2017-10-31 (×5): qty 1

## 2017-10-31 MED ORDER — OXYCODONE HCL 5 MG PO TABS
5.0000 mg | ORAL_TABLET | ORAL | Status: DC | PRN
  Administered 2017-11-02: 5 mg via ORAL
  Filled 2017-10-31: qty 1

## 2017-10-31 MED ORDER — ONDANSETRON HCL 4 MG PO TABS: 4.0000 mg | ORAL_TABLET | Freq: Four times a day (QID) | ORAL | Status: DC | PRN

## 2017-10-31 MED ORDER — CEFAZOLIN SODIUM-DEXTROSE 2-4 GM/100ML-% IV SOLN
2.0000 g | Freq: Four times a day (QID) | INTRAVENOUS | Status: AC
  Administered 2017-10-31 – 2017-11-01 (×3): 2 g via INTRAVENOUS
  Filled 2017-10-31 (×3): qty 100

## 2017-10-31 MED ORDER — ACETAMINOPHEN 500 MG PO TABS
1000.0000 mg | ORAL_TABLET | Freq: Four times a day (QID) | ORAL | Status: AC
  Administered 2017-10-31 – 2017-11-01 (×4): 1000 mg via ORAL
  Filled 2017-10-31 (×4): qty 2

## 2017-10-31 MED ORDER — APIXABAN 5 MG PO TABS: 5.0000 mg | ORAL_TABLET | Freq: Two times a day (BID) | ORAL | Status: DC

## 2017-10-31 MED ORDER — KETOROLAC TROMETHAMINE 30 MG/ML IJ SOLN
30.0000 mg | Freq: Once | INTRAMUSCULAR | Status: AC
  Administered 2017-10-31: 30 mg via INTRAVENOUS

## 2017-10-31 MED ORDER — FAMOTIDINE 20 MG PO TABS
ORAL_TABLET | ORAL | Status: AC
  Administered 2017-10-31: 20 mg
  Filled 2017-10-31: qty 1

## 2017-10-31 MED ORDER — DIPHENHYDRAMINE HCL 12.5 MG/5ML PO ELIX: 12.5000 mg | ORAL_SOLUTION | ORAL | Status: DC | PRN

## 2017-10-31 MED ORDER — FAMOTIDINE 20 MG PO TABS: 20.0000 mg | ORAL_TABLET | Freq: Once | ORAL | Status: DC

## 2017-10-31 MED ORDER — NEOMYCIN-POLYMYXIN B GU 40-200000 IR SOLN
Status: AC
  Filled 2017-10-31: qty 20

## 2017-10-31 MED ORDER — DILTIAZEM HCL ER COATED BEADS 120 MG PO TB24
120.0000 mg | ORAL_TABLET | Freq: Every day | ORAL | Status: DC
  Administered 2017-11-02: 120 mg via ORAL
  Filled 2017-10-31 (×2): qty 1

## 2017-10-31 MED ORDER — METOCLOPRAMIDE HCL 10 MG PO TABS: 5.0000 mg | ORAL_TABLET | Freq: Three times a day (TID) | ORAL | Status: DC | PRN

## 2017-10-31 MED ORDER — ENOXAPARIN SODIUM 40 MG/0.4ML ~~LOC~~ SOLN
40.0000 mg | SUBCUTANEOUS | Status: DC
  Administered 2017-11-01 – 2017-11-02 (×2): 40 mg via SUBCUTANEOUS
  Filled 2017-10-31 (×2): qty 0.4

## 2017-10-31 MED ORDER — MAGNESIUM HYDROXIDE 400 MG/5ML PO SUSP
30.0000 mL | Freq: Every day | ORAL | Status: DC | PRN
  Administered 2017-11-02: 30 mL via ORAL
  Filled 2017-10-31: qty 30

## 2017-10-31 MED ORDER — BUPIVACAINE HCL (PF) 0.5 % IJ SOLN
INTRAMUSCULAR | Status: DC | PRN
  Administered 2017-10-31: 3 mL

## 2017-10-31 MED ORDER — SODIUM CHLORIDE 0.9 % IV SOLN
INTRAVENOUS | Status: DC
  Administered 2017-10-31: 14:00:00 via INTRAVENOUS

## 2017-10-31 MED ORDER — PROPOFOL 10 MG/ML IV BOLUS
INTRAVENOUS | Status: DC | PRN
  Administered 2017-10-31: 17 mg via INTRAVENOUS
  Administered 2017-10-31: 30 mg via INTRAVENOUS

## 2017-10-31 MED ORDER — TRANEXAMIC ACID 1000 MG/10ML IV SOLN
INTRAVENOUS | Status: AC
  Filled 2017-10-31: qty 10

## 2017-10-31 SURGICAL SUPPLY — 60 items
BANDAGE ELASTIC 6 LF NS (GAUZE/BANDAGES/DRESSINGS) ×2 IMPLANT
BEARING TIBIAL VG AS 75X16 (Joint) ×1 IMPLANT
BLADE SAW SAG 25X90X1.19 (BLADE) ×2 IMPLANT
BLADE SURG SZ20 CARB STEEL (BLADE) ×2 IMPLANT
CANISTER SUCT 1200ML W/VALVE (MISCELLANEOUS) ×2 IMPLANT
CANISTER SUCT 3000ML PPV (MISCELLANEOUS) ×2 IMPLANT
CEMENT BONE R 1X40 (Cement) ×4 IMPLANT
CEMENT VACUUM MIXING SYSTEM (MISCELLANEOUS) ×2 IMPLANT
CHLORAPREP W/TINT 26ML (MISCELLANEOUS) ×2 IMPLANT
COOLER POLAR GLACIER W/PUMP (MISCELLANEOUS) ×2 IMPLANT
COVER MAYO STAND STRL (DRAPES) ×2 IMPLANT
CUFF TOURN 24 STER (MISCELLANEOUS) ×2 IMPLANT
CUFF TOURN 30 STER DUAL PORT (MISCELLANEOUS) IMPLANT
DRAPE IMP U-DRAPE 54X76 (DRAPES) ×2 IMPLANT
DRAPE INCISE IOBAN 66X45 STRL (DRAPES) ×2 IMPLANT
DRAPE SHEET LG 3/4 BI-LAMINATE (DRAPES) ×2 IMPLANT
DRSG OPSITE POSTOP 4X10 (GAUZE/BANDAGES/DRESSINGS) ×2 IMPLANT
DRSG OPSITE POSTOP 4X8 (GAUZE/BANDAGES/DRESSINGS) ×2 IMPLANT
ELECT CAUTERY BLADE 6.4 (BLADE) ×2 IMPLANT
ELECT REM PT RETURN 9FT ADLT (ELECTROSURGICAL) ×2
ELECTRODE REM PT RTRN 9FT ADLT (ELECTROSURGICAL) ×1 IMPLANT
FEMORAL CR LEFT  70MM (Joint) ×1 IMPLANT
FEMORAL CR LEFT 70MM (Joint) ×1 IMPLANT
GLOVE BIO SURGEON STRL SZ7.5 (GLOVE) ×8 IMPLANT
GLOVE BIO SURGEON STRL SZ8 (GLOVE) ×8 IMPLANT
GLOVE BIOGEL PI IND STRL 8 (GLOVE) ×1 IMPLANT
GLOVE BIOGEL PI INDICATOR 8 (GLOVE) ×1
GLOVE INDICATOR 8.0 STRL GRN (GLOVE) ×2 IMPLANT
GOWN STRL REUS W/ TWL LRG LVL3 (GOWN DISPOSABLE) ×1 IMPLANT
GOWN STRL REUS W/ TWL XL LVL3 (GOWN DISPOSABLE) ×1 IMPLANT
GOWN STRL REUS W/TWL LRG LVL3 (GOWN DISPOSABLE) ×1
GOWN STRL REUS W/TWL XL LVL3 (GOWN DISPOSABLE) ×1
HOLDER FOLEY CATH W/STRAP (MISCELLANEOUS) ×2 IMPLANT
HOOD PEEL AWAY FLYTE STAYCOOL (MISCELLANEOUS) ×6 IMPLANT
IMMBOLIZER KNEE 19 BLUE UNIV (SOFTGOODS) ×2 IMPLANT
KIT TURNOVER KIT A (KITS) ×2 IMPLANT
NDL SAFETY ECLIPSE 18X1.5 (NEEDLE) ×2 IMPLANT
NEEDLE HYPO 18GX1.5 SHARP (NEEDLE) ×2
NEEDLE SPNL 20GX3.5 QUINCKE YW (NEEDLE) ×2 IMPLANT
NS IRRIG 1000ML POUR BTL (IV SOLUTION) ×2 IMPLANT
PACK TOTAL KNEE (MISCELLANEOUS) ×2 IMPLANT
PAD WRAPON POLAR KNEE (MISCELLANEOUS) ×1 IMPLANT
PATELLA STD 34X8.5 (Orthopedic Implant) ×2 IMPLANT
PLATE KNEE TIBIAL 75MM FIXED (Plate) ×2 IMPLANT
PULSAVAC PLUS IRRIG FAN TIP (DISPOSABLE) ×2
SOL .9 NS 3000ML IRR  AL (IV SOLUTION) ×1
SOL .9 NS 3000ML IRR UROMATIC (IV SOLUTION) ×1 IMPLANT
STAPLER SKIN PROX 35W (STAPLE) ×2 IMPLANT
SUCTION FRAZIER HANDLE 10FR (MISCELLANEOUS) ×1
SUCTION TUBE FRAZIER 10FR DISP (MISCELLANEOUS) ×1 IMPLANT
SUT VIC AB 0 CT1 36 (SUTURE) ×6 IMPLANT
SUT VIC AB 2-0 CT1 27 (SUTURE) ×3
SUT VIC AB 2-0 CT1 TAPERPNT 27 (SUTURE) ×3 IMPLANT
SYR 10ML LL (SYRINGE) ×2 IMPLANT
SYR 20CC LL (SYRINGE) ×2 IMPLANT
SYR 30ML LL (SYRINGE) ×6 IMPLANT
TIBIAL BEARING VG AS 75X16 (Joint) ×2 IMPLANT
TIP FAN IRRIG PULSAVAC PLUS (DISPOSABLE) ×1 IMPLANT
TRAY FOLEY MTR SLVR 16FR STAT (SET/KITS/TRAYS/PACK) ×2 IMPLANT
WRAPON POLAR PAD KNEE (MISCELLANEOUS) ×2

## 2017-10-31 NOTE — Op Note (Signed)
10/31/2017  10:18 AM  Patient:   Stephen Erickson  Pre-Op Diagnosis:   Degenerative joint disease, left knee.  Post-Op Diagnosis:   Same  Procedure:   Left TKA using all-cemented Bi[employer] Vanguard system with a 70 mm PCR femur, a 75 mm tibial tray with a 16 mm AS E-poly insert, and a 34 x 8.5 mm all-poly 3-pegged domed patella.  Surgeon:   Pascal Lux, MD  Assistant:   Cameron Proud, PA-C   Anesthesia:   Spinal  Findings:   As above  Complications:   None  EBL:   10 cc  Fluids:   1000 cc crystalloid  UOP:   420 cc  TT:   105 minutes at 300 mmHg  Drains:   None  Closure:   Staples  Implants:   As above  Brief Clinical Note:   The patient is a 62 year old male with a long history of progressively worsening left knee pain. The patient's symptoms have progressed despite medications, activity modification, injections, etc. The patient's history and examination were consistent with advanced degenerative joint disease of the right knee confirmed by plain radiographs. The patient presents at this time for a left total knee arthroplasty.  Procedure:   The patient was brought into the operating room. After adequate spinal anesthesia was obtained, the patient was lain in the supine position. A Foley catheter was placed by the nurse before the right lower extremity was prepped with ChloraPrep solution and draped sterilely. Preoperative antibiotics were administered. After verifying the proper laterality with a surgical timeout, the limb was exsanguinated with an Esmarch and the tourniquet inflated to 300 mmHg. A standard anterior approach to the knee was made through an approximately 7 inch incision. The incision was carried down through the subcutaneous tissues to expose superficial retinaculum. This was split the length of the incision and the medial flap elevated sufficiently to expose the medial retinaculum. The medial retinaculum was incised, leaving a 3-4 mm cuff of tissue on the  patella. This was extended distally along the medial border of the patellar tendon and proximally through the medial third of the quadriceps tendon. A subtotal fat pad excision was performed before the soft tissues were elevated off the anteromedial and anterolateral aspects of the proximal tibia to the level of the collateral ligaments. The anterior portions of the medial and lateral menisci were removed, as was the anterior cruciate ligament. With the knee flexed to 90, the external tibial guide was positioned and the appropriate proximal tibial cut made. This piece was taken to the back table where it was measured and found to be optimally replicated by a 75 mm component.  Attention was directed to the distal femur. The intramedullary canal was accessed through a 3/8" drill hole. The intramedullary guide was inserted and position in order to obtain a neutral flexion gap. The intercondylar block was positioned with care taken to avoid notching the anterior cortex of the femur. The appropriate cut was made. Next, the distal cutting block was placed at 6 of valgus alignment. Using the 9 mm slot, the distal cut was made. The distal femur was measured and found to be optimally replicated by the 70 mm component. The 70 mm 4-in-1 cutting block was positioned and first the posterior, then the posterior chamfer, the anterior chamfer, femoral and intercondylar cuts were made. At this point, the posterior portions medial and lateral menisci were removed. A trial reduction was performed using the appropriate femoral and tibial components with first the  12 mm, then the 14 mm, and finally the 16 mm inserts. The 16 mm insert demonstrated excellent stability to varus and valgus stressing both in flexion and extension while permitting full extension. Patella tracking was assessed and found to be excellent. Therefore, the tibial guide position was marked on the proximal tibia. The patella thickness was measured and found to be  22 mm. Therefore, the appropriate cut was made. The patellar surface was measured and found to be optimally replicated by the 34 mm component. The three peg holes were drilled in place before the trial button was inserted. Patella tracking was assessed and found to be excellent, passing the "no thumb test". The lug holes were drilled into the distal femur before the trial component was removed, leaving only the tibial tray. The keel was then created using the appropriate tower, reamer, and punch.  The bony surfaces were prepared for cementing by irrigating thoroughly with bacitracin saline solution. A bone plug was fashioned from some of the bone that had been removed previously and used to plug the distal femoral canal. In addition, 20 cc of Exparel diluted out to 60 cc with normal saline and 30 cc of 0.5% Sensorcaine were injected into the postero-medial and postero-lateral aspects of the knee, the medial and lateral gutter regions, and the peri-incisional tissues to help with postoperative analgesia. Meanwhile, the cement was being mixed on the back table. When it was ready, the tibial tray was cemented in first. The excess cement was removed using [employer] fast streamer. Next, the femoral component was impacted into place. Again, the excess cement was removed using [employer] fast streamer. The 16 mm trial insert was positioned and the knee brought into extension while the cement hardened. Finally, the patella was cemented into place and secured using the patellar clamp. Again, the excess cement was removed using [employer] fast streamer. Once the cement had hardened, the knee was placed through a range of motion with the findings as described above. Therefore, the trial insert was removed and, after verifying that no cement had been retained posteriorly, the permanent 73mm anterior stabilized polyethylene insert was positioned and secured using the appropriate key locking mechanism. Again the knee was placed through a range of motion  with the findings as described above.  The wound was copiously irrigated with bacitracin saline solution using the jet lavage system before the quadriceps tendon and retinacular layer were reapproximated using #0 Vicryl interrupted sutures. The superficial retinacular layer also was closed using a running #0 Vicryl suture. A total of 10 cc of transexemic acid (TXA) was injected intra-articularly before the subcutaneous tissues were closed in several layers using 2-0 Vicryl interrupted sutures. The skin was closed using staples. A sterile honeycomb dressing was applied to the skin before the leg was wrapped with an Ace wrap to accommodate the polar pack. The patient was then awakened and returned to the recovery room in satisfactory condition after tolerating the procedure well.

## 2017-10-31 NOTE — Anesthesia Preprocedure Evaluation (Signed)
Anesthesia Evaluation  Patient identified by MRN, date of birth, ID band Patient awake    Reviewed: Allergy & Precautions, NPO status , Patient's Chart, lab work & pertinent test results, reviewed documented beta blocker date and time   History of Anesthesia Complications Negative for: history of anesthetic complications  Airway Mallampati: III       Dental  (+) Upper Dentures, Poor Dentition, Chipped, Missing   Pulmonary neg sleep apnea, neg COPD,           Cardiovascular hypertension, + Past MI, + Cardiac Stents and + Peripheral Vascular Disease  (-) CHF (-) dysrhythmias (-) Valvular Problems/Murmurs     Neuro/Psych neg Seizures    GI/Hepatic Neg liver ROS, neg GERD  ,  Endo/Other  neg diabetes  Renal/GU negative Renal ROS     Musculoskeletal   Abdominal   Peds  Hematology   Anesthesia Other Findings   Reproductive/Obstetrics                            Anesthesia Physical Anesthesia Plan  ASA: III  Anesthesia Plan: Spinal   Post-op Pain Management:    Induction:   PONV Risk Score and Plan:   Airway Management Planned: Nasal Cannula  Additional Equipment:   Intra-op Plan:   Post-operative Plan:   Informed Consent: I have reviewed the patients History and Physical, chart, labs and discussed the procedure including the risks, benefits and alternatives for the proposed anesthesia with the patient or authorized representative who has indicated his/her understanding and acceptance.     Plan Discussed with:   Anesthesia Plan Comments:         Anesthesia Quick Evaluation

## 2017-10-31 NOTE — Anesthesia Post-op Follow-up Note (Signed)
Anesthesia QCDR form completed.        

## 2017-10-31 NOTE — Progress Notes (Signed)
Feels like he has scratch in right eye  Warm compress on and flushed  with saline

## 2017-10-31 NOTE — Progress Notes (Signed)
Pt is oriented to unit and admission complete. VSS. Pulses intact. Can wiggle toes and has full sensation to bilateral lower extremities. CPM machine on, pt is tolerating well. Pt R eye is red, antibiotic ointment applied per Ophthalmic orders. Will continue to monitor.

## 2017-10-31 NOTE — Progress Notes (Signed)
PT Cancellation Note  Patient Details Name: Stephen Erickson MRN: [id number] DOB: 05/09/1956   Cancelled Treatment:    Reason Eval/Treat Not Completed: Other (comment).  PT consult received.  Chart reviewed.  Pt s/p surgery today and upon assessment pt reporting sensation had not fully returned in B LE's.  Will re-attempt PT evaluation at a later date/time.  Leitha Bleak, PT 10/31/17, 3:12 PM [phone]

## 2017-10-31 NOTE — Anesthesia Procedure Notes (Signed)
Spinal  Patient location during procedure: OR Staffing Resident/CRNA: Markise Haymer, CRNA Performed: resident/CRNA  Preanesthetic Checklist Completed: patient identified, site marked, surgical consent, pre-op evaluation, timeout performed, IV checked, risks and benefits discussed and monitors and equipment checked Spinal Block Patient position: sitting Prep: ChloraPrep Patient monitoring: heart rate, continuous pulse ox, blood pressure and cardiac monitor Approach: midline Location: L3-4 Injection technique: single-shot Needle Needle type: Introducer and Pencan  Needle gauge: 24 G Needle length: 9 cm Additional Notes Negative paresthesia. Negative blood return. Positive free-flowing CSF. Expiration date of kit checked and confirmed. Patient tolerated procedure well, without complications.       

## 2017-10-31 NOTE — Transfer of Care (Signed)
Immediate Anesthesia Transfer of Care Note  Patient: Stephen Erickson  Procedure(s) Performed: TOTAL KNEE ARTHROPLASTY (Left Knee)  Patient Location: PACU  Anesthesia Type:Spinal  Level of Consciousness: awake and patient cooperative  Airway & Oxygen Therapy: Patient Spontanous Breathing and Patient connected to nasal cannula oxygen  Post-op Assessment: Report given to RN and Post -op Vital signs reviewed and stable  Post vital signs: Reviewed and stable  Last Vitals:  Vitals Value Taken Time  BP    Temp    Pulse 67 10/31/2017 10:17 AM  Resp 11 10/31/2017 10:17 AM  SpO2 96 % 10/31/2017 10:17 AM  Vitals shown include unvalidated device data.  Last Pain:  Vitals:   10/31/17 0641  TempSrc: Oral  PainSc: 0-No pain      Patients Stated Pain Goal: 0 (60/60/04 5997)  Complications: No apparent anesthesia complications

## 2017-10-31 NOTE — Clinical Social Work Note (Addendum)
CSW received a referral for patient needing SNF placement, PT is pending, awaiting recommendations.  Jones Broom. [place], MSW, [place]  10/31/2017 5:04 PM

## 2017-10-31 NOTE — H&P (Signed)
Paper H&P to be scanned into permanent record. H&P reviewed and patient re-examined. No changes. 

## 2017-10-31 NOTE — Progress Notes (Signed)
Pt states that his eye feels better dr Ronelle Nigh in to see pt   No more events of increase heart rate

## 2017-10-31 NOTE — Progress Notes (Signed)
Heart rate up to 115 very briefly  Heart rate down to 67 with frequent pvc's  Dr Ronelle Nigh aware  No new orders

## 2017-11-01 ENCOUNTER — Encounter: Payer: Self-pay | Admitting: Surgery

## 2017-11-01 LAB — BASIC METABOLIC PANEL
ANION GAP: 6 (ref 5–15)
BUN: 11 mg/dL (ref 6–20)
CO2: 24 mmol/L (ref 22–32)
Calcium: 8.2 mg/dL — ABNORMAL LOW (ref 8.9–10.3)
Chloride: 105 mmol/L (ref 101–111)
Creatinine, Ser: 0.77 mg/dL (ref 0.61–1.24)
GFR calc Af Amer: >60 mL/min (ref >60)
GFR calc non Af Amer: >60 mL/min (ref >60)
GLUCOSE: 107 mg/dL — AB (ref 65–99)
POTASSIUM: 3.5 mmol/L (ref 3.5–5.1)
Sodium: 135 mmol/L (ref 135–145)

## 2017-11-01 LAB — CBC WITH DIFFERENTIAL/PLATELET
BASOS ABS: 0 10*3/uL (ref 0–0.1)
Basophils Relative: 1 %
Eosinophils Absolute: 0.1 10*3/uL (ref 0–0.7)
Eosinophils Relative: 2 %
HEMATOCRIT: 41.3 % (ref 40.0–52.0)
Hemoglobin: 14.4 g/dL (ref 13.0–18.0)
LYMPHS ABS: 0.9 10*3/uL — AB (ref 1.0–3.6)
LYMPHS PCT: 18 %
MCH: 32.2 pg (ref 26.0–34.0)
MCHC: 35 g/dL (ref 32.0–36.0)
MCV: 92 fL (ref 80.0–100.0)
Monocytes Absolute: 0.4 10*3/uL (ref 0.2–1.0)
Monocytes Relative: 9 %
NEUTROS ABS: 3.5 10*3/uL (ref 1.4–6.5)
Neutrophils Relative %: 70 %
Platelets: 125 10*3/uL — ABNORMAL LOW (ref 150–440)
RBC: 4.49 MIL/uL (ref 4.40–5.90)
RDW: 14.3 % (ref 11.5–14.5)
WBC: 4.9 10*3/uL (ref 3.8–10.6)

## 2017-11-01 NOTE — Care Management Note (Signed)
Case Management Note  Patient Details  Name: Stephen Erickson MRN: [id number] Date of Birth: May 16, 1956  Subjective/Objective:  POD # 1 left knee. Met with patient at bedside to discuss discharge planning. Patient lives at home alone. He has help from neighbors and can call his nephew if needed. He has a walker. Offered a list of home care providers. Referral to [facility] for HHPT. [facility]: [facility] [phone]. Called Lovenox 40 mg # 14 no refills.                   Action/Plan: [facility] for HHPT, Lovenox called in.   Expected Discharge Date:  11/03/17               Expected Discharge Plan:  [place]  In-House Referral:     Discharge planning Services  CM Consult  Post Acute Care Choice:  Home Health Choice offered to:  Patient  DME Arranged:    DME Agency:     HH Arranged:  PT [place]:  [facility] (formerly [facility])  Status of Service:  In process, will continue to follow  If discussed at Long Length of Stay Meetings, dates discussed:    Additional Comments:  [name], RN 11/01/2017, 11:09 AM

## 2017-11-01 NOTE — Anesthesia Postprocedure Evaluation (Signed)
Anesthesia Post Note  Patient: Stephen Erickson  Procedure(s) Performed: TOTAL KNEE ARTHROPLASTY (Left Knee)  Patient location during evaluation: Nursing Unit Anesthesia Type: Spinal Level of consciousness: awake and alert and oriented Pain management: satisfactory to patient Vital Signs Assessment: post-procedure vital signs reviewed and stable Respiratory status: respiratory function stable Cardiovascular status: stable Postop Assessment: no headache, no backache, spinal receding, no apparent nausea or vomiting, patient able to bend at knees, adequate PO intake and able to ambulate Anesthetic complications: no     Last Vitals:  Vitals:   10/31/17 2335 11/01/17 0343  BP: 122/77 108/73  Pulse: 68 (!) 59  Resp: 19 19  Temp: 37.2 C 36.6 C  SpO2: 95% 95%    Last Pain:  Vitals:   11/01/17 0615  TempSrc:   PainSc: 2                  Blima Singer

## 2017-11-01 NOTE — Evaluation (Signed)
Physical Therapy Evaluation Patient Details Name: Stephen Erickson MRN: [id number] DOB: [birth year] Today's Date: 11/01/2017   History of Present Illness  Pt is a 62 y.o. 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  PMH includes htn, h/o MI, (+) cardiac stents, PVD.  Clinical Impression  Prior to hospital admission, pt was independent.  Pt lives alone in 1 level home with 3 steps to enter with B railings.  Currently pt is SBA supine to sit; CGA with transfers; and CGA ambulating 45 feet with RW.  L knee pain 0/10 at rest and 2/10 with activity and end of session.  Able to perform L LE SLR independently.  L knee AROM: 4 degrees short of neutral for extension; 88 degrees flexion.  Pt would benefit from skilled PT to address noted impairments and functional limitations (see below for any additional details).  Upon hospital discharge, recommend pt discharge to home with HHPT.    Follow Up Recommendations Home health PT    Equipment Recommendations  Rolling walker with 5" wheels    Recommendations for Other Services       Precautions / Restrictions Precautions Precautions: Knee;Fall Precaution Booklet Issued: Yes (comment) Restrictions Weight Bearing Restrictions: Yes LLE Weight Bearing: Weight bearing as tolerated      Mobility  Bed Mobility Overal bed mobility: Needs Assistance Bed Mobility: Supine to Sit     Supine to sit: Supervision;HOB elevated     General bed mobility comments: SBA for safety  Transfers Overall transfer level: Needs assistance Equipment used: Rolling walker (2 wheeled) Transfers: Sit to/from Stand Sit to Stand: Min guard         General transfer comment: vc's for technique initially  Ambulation/Gait Ambulation/Gait assistance: Min guard Ambulation Distance (Feet): 45 Feet Assistive device: Rolling walker (2 wheeled)   Gait velocity: decreased   General Gait Details: decreased stance time L LE; increased UE support through RW to offweight L  LE; vc's for gait technique and walker use initially  Stairs            Wheelchair Mobility    Modified Rankin (Stroke Patients Only)       Balance Overall balance assessment: Needs assistance Sitting-balance support: No upper extremity supported;Feet supported Sitting balance-Leahy Scale: Normal Sitting balance - Comments: steady sitting reaching outside BOS   Standing balance support: No upper extremity supported Standing balance-Leahy Scale: Fair Standing balance comment: steady static standing no UE support                             Pertinent Vitals/Pain Pain Assessment: 0-10 Pain Score: 2  Pain Location: L knee Pain Descriptors / Indicators: Sore Pain Intervention(s): Limited activity within patient's tolerance;Monitored during session;Repositioned;Patient requesting pain meds-RN notified;RN gave pain meds during session;Other (comment)(Polar care applied and activated)    Home Living Family/patient expects to be discharged to:: Private residence Living Arrangements: Alone   Type of Home: House Home Access: Stairs to enter Entrance Stairs-Rails: Psychiatric nurse of Steps: 3 Home Layout: One level [place]: Grab bars - tub/shower;Walker - 2 wheels;Cane - single point      Prior Function Level of Independence: Independent         Comments: Pt reports no falls in past 6 months.     Hand Dominance        Extremity/Trunk Assessment   Upper Extremity Assessment Upper Extremity Assessment: Overall WFL for tasks assessed    Lower Extremity Assessment  Lower Extremity Assessment: RLE deficits/detail;LLE deficits/detail RLE Deficits / Details: strength and ROM WFL LLE Deficits / Details: able to perform L LE SLR independently; good quad set; at least 3/5 DF and hip flexion AROM LLE: Unable to fully assess due to pain    Cervical / Trunk Assessment Cervical / Trunk Assessment: Normal  Communication    Communication: No difficulties  Cognition Arousal/Alertness: Awake/alert Behavior During Therapy: WFL for tasks assessed/performed Overall Cognitive Status: Within Functional Limits for tasks assessed                                        General Comments   Nursing cleared pt for participation in physical therapy.  Pt agreeable to PT session.    Exercises Total Joint Exercises Ankle Circles/Pumps: AROM;Strengthening;Both;10 reps;Supine Quad Sets: AROM;Strengthening;Left;10 reps;Supine Short Arc Quad: AAROM;Strengthening;Left;10 reps;Supine Heel Slides: AAROM;Strengthening;Left;10 reps;Supine Hip ABduction/ADduction: AAROM;Strengthening;Left;10 reps;Supine Straight Leg Raises: AROM;Strengthening;Left;10 reps;Supine Goniometric ROM: L knee extension AROM 4 degrees short of neutral semi-supine in bed; L knee flexion 88 degrees AROM in sitting   Assessment/Plan    PT Assessment Patient needs continued PT services  PT Problem List Decreased strength;Decreased range of motion;Decreased balance;Decreased mobility;Decreased knowledge of use of DME;Decreased knowledge of precautions;Pain;Decreased skin integrity       PT Treatment Interventions DME instruction;Gait training;Stair training;Functional mobility training;Therapeutic activities;Therapeutic exercise;Balance training;Patient/family education    PT Goals (Current goals can be found in the Care Plan section)  Acute Rehab PT Goals Patient Stated Goal: to go home PT Goal Formulation: With patient Time For Goal Achievement: 11/15/17 Potential to Achieve Goals: Good    Frequency BID   Barriers to discharge        Co-evaluation               AM-PAC PT "6 Clicks" Daily Activity  Outcome Measure Difficulty turning over in bed (including adjusting bedclothes, sheets and blankets)?: A Little Difficulty moving from lying on back to sitting on the side of the bed? : A Little Difficulty sitting down on and  standing up from a chair with arms (e.g., wheelchair, bedside commode, etc,.)?: Unable Help needed moving to and from a bed to chair (including a wheelchair)?: A Little Help needed walking in hospital room?: A Little Help needed climbing 3-5 steps with a railing? : A Little 6 Click Score: 16    End of Session Equipment Utilized During Treatment: Gait belt Activity Tolerance: Patient tolerated treatment well Patient left: in chair;with call bell/phone within reach;with chair alarm set;with SCD's reapplied;Other (comment)(B heels elevated via towel rolls; polar care in place and activated) Nurse Communication: Mobility status;Precautions;Patient requests pain meds;Weight bearing status PT Visit Diagnosis: Other abnormalities of gait and mobility (R26.89);Muscle weakness (generalized) (M62.81);Difficulty in walking, not elsewhere classified (R26.2);Pain Pain - Right/Left: Left Pain - part of body: Knee    Time: 3536-1443 PT Time Calculation (min) (ACUTE ONLY): 32 min   Charges:   PT Evaluation $PT Eval Low Complexity: 1 Low PT Treatments $Therapeutic Exercise: 8-22 mins   PT G CodesLeitha Bleak, PT 11/01/17, 9:20 AM [phone]

## 2017-11-01 NOTE — Clinical Social Work Note (Signed)
CSW received referral for SNF.  Case discussed with case manager and plan is to discharge home with home health.  CSW to sign off please re-consult if social work needs arise.  Nazim Kadlec R. Danyon Mcginness, MSW, LCSWA [phone]  

## 2017-11-01 NOTE — Progress Notes (Signed)
Physical Therapy Treatment Patient Details Name: Stephen Erickson MRN: [id number] DOB: 1955/08/08 Today's Date: 11/01/2017    History of Present Illness Pt is a 62 y.o. 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  PMH includes htn, h/o MI, (+) cardiac stents, PVD.    PT Comments    Pt able to progress to ambulating 120 feet CGA with RW.  Pain L knee 0/10 beginning of session and 3/10 end of session resting in bed.  Overall tolerated session's activities well.  Will continue to progress pt's L knee ROM, strengthening, increasing ambulation distance, and attempting stairs next session.   Follow Up Recommendations  Home health PT     Equipment Recommendations  Rolling walker with 5" wheels    Recommendations for Other Services       Precautions / Restrictions Precautions Precautions: Knee;Fall Precaution Booklet Issued: Yes (comment) Restrictions Weight Bearing Restrictions: Yes LLE Weight Bearing: Weight bearing as tolerated    Mobility  Bed Mobility Overal bed mobility: Needs Assistance Bed Mobility: Sit to Supine       Sit to supine: Supervision;HOB elevated   General bed mobility comments: SBA for safety  Transfers Overall transfer level: Needs assistance Equipment used: Rolling walker (2 wheeled) Transfers: Sit to/from Stand Sit to Stand: Min guard;Supervision         General transfer comment: x2 trials; vc's for safety initially  Ambulation/Gait Ambulation/Gait assistance: Min guard Ambulation Distance (Feet): 120 Feet Assistive device: Rolling walker (2 wheeled)   Gait velocity: decreased   General Gait Details: decreased stance time L LE; increased UE support through RW to offweight L LE; vc's for gait technique and walker use occasionally   Stairs             Wheelchair Mobility    Modified Rankin (Stroke Patients Only)       Balance Overall balance assessment: Needs assistance Sitting-balance support: No upper extremity  supported;Feet supported Sitting balance-Leahy Scale: Normal Sitting balance - Comments: steady sitting reaching outside BOS   Standing balance support: No upper extremity supported Standing balance-Leahy Scale: Fair Standing balance comment: steady standing pulling up shorts                            Cognition Arousal/Alertness: Awake/alert Behavior During Therapy: WFL for tasks assessed/performed Overall Cognitive Status: Within Functional Limits for tasks assessed                                        Exercises Total Joint Exercises Long Arc Quad: AROM;Strengthening;Left;10 reps;Seated Knee Flexion: AROM;Strengthening;Left;10 reps;Seated General Exercises - Lower Extremity Hip Flexion/Marching: AROM;Strengthening;Both;10 reps;Seated    General Comments  Pt agreeable to PT session.      Pertinent Vitals/Pain Pain Assessment: 0-10 Pain Score: 3  Pain Location: L knee Pain Descriptors / Indicators: Sore Pain Intervention(s): Limited activity within patient's tolerance;Monitored during session;Repositioned;Other (comment)(polar care applied and activated)    Home Living                      Prior Function            PT Goals (current goals can now be found in the care plan section) Acute Rehab PT Goals Patient Stated Goal: to go home PT Goal Formulation: With patient Time For Goal Achievement: 11/15/17 Potential to Achieve Goals: Good Progress towards  PT goals: Progressing toward goals    Frequency    BID      PT Plan Current plan remains appropriate    Co-evaluation              AM-PAC PT "6 Clicks" Daily Activity  Outcome Measure  Difficulty turning over in bed (including adjusting bedclothes, sheets and blankets)?: A Little Difficulty moving from lying on back to sitting on the side of the bed? : A Little Difficulty sitting down on and standing up from a chair with arms (e.g., wheelchair, bedside commode,  etc,.)?: Unable Help needed moving to and from a bed to chair (including a wheelchair)?: A Little Help needed walking in hospital room?: A Little Help needed climbing 3-5 steps with a railing? : A Little 6 Click Score: 16    End of Session Equipment Utilized During Treatment: Gait belt Activity Tolerance: Patient tolerated treatment well Patient left: in bed;with call bell/phone within reach;with bed alarm set;with SCD's reapplied;Other (comment)(B heels elevated via towel rolls; polar care in place and activated) Nurse Communication: Mobility status;Precautions;Weight bearing status;Other (comment)(Pt's pain status and pt's request to have CPM placed around 3pm today) PT Visit Diagnosis: Other abnormalities of gait and mobility (R26.89);Muscle weakness (generalized) (M62.81);Difficulty in walking, not elsewhere classified (R26.2);Pain Pain - Right/Left: Left Pain - part of body: Knee     Time: 3300-7622 PT Time Calculation (min) (ACUTE ONLY): 23 min  Charges:  $Therapeutic Exercise: 8-22 mins $Therapeutic Activity: 8-22 mins                    G CodesLeitha Bleak, PT 11/01/17, 1:38 PM [phone]

## 2017-11-01 NOTE — Plan of Care (Signed)
  Problem: Education: Goal: Knowledge of General Education information will improve Outcome: Progressing   Problem: Health Behavior/Discharge Planning: Goal: Ability to manage health-related needs will improve Outcome: Progressing   Problem: Clinical Measurements: Goal: Ability to maintain clinical measurements within normal limits will improve Outcome: Progressing Goal: Will remain free from infection Outcome: Progressing Goal: Diagnostic test results will improve Outcome: Progressing Goal: Respiratory complications will improve Outcome: Progressing Goal: Cardiovascular complication will be avoided Outcome: Progressing   Problem: Activity: Goal: Risk for activity intolerance will decrease Outcome: Progressing   Problem: Nutrition: Goal: Adequate nutrition will be maintained Outcome: Progressing   Problem: Coping: Goal: Level of anxiety will decrease Outcome: Progressing   Problem: Elimination: Goal: Will not experience complications related to bowel motility Outcome: Progressing Goal: Will not experience complications related to urinary retention Outcome: Progressing   

## 2017-11-02 LAB — BASIC METABOLIC PANEL
ANION GAP: 5 (ref 5–15)
BUN: 11 mg/dL (ref 6–20)
CALCIUM: 8.3 mg/dL — AB (ref 8.9–10.3)
CHLORIDE: 105 mmol/L (ref 101–111)
CO2: 25 mmol/L (ref 22–32)
CREATININE: 0.78 mg/dL (ref 0.61–1.24)
GFR calc non Af Amer: >60 mL/min (ref >60)
Glucose, Bld: 102 mg/dL — ABNORMAL HIGH (ref 65–99)
Potassium: 3.5 mmol/L (ref 3.5–5.1)
SODIUM: 135 mmol/L (ref 135–145)

## 2017-11-02 MED ORDER — OXYCODONE HCL 5 MG PO TABS: 5.0000 mg | ORAL_TABLET | ORAL | 0 refills | Status: DC | PRN

## 2017-11-02 MED ORDER — ENOXAPARIN SODIUM 40 MG/0.4ML ~~LOC~~ SOLN: 40.0000 mg | SUBCUTANEOUS | 0 refills | Status: DC

## 2017-11-02 NOTE — Progress Notes (Signed)
Patient is being discharged home with family. IV removed with cath intact. Reviewed scripts, meds, and last dose given.  Patient refused to have operative TED placed, stating "I can put that on after I get cleaned up at home."  Extra dressing sent with patient.

## 2017-11-02 NOTE — Progress Notes (Addendum)
Physical Therapy Treatment Patient Details Name: Stephen Erickson MRN: [id number] DOB: 08/07/1955 Today's Date: 11/02/2017    History of Present Illness Pt is a 62 y.o. 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  PMH includes htn, h/o MI, (+) cardiac stents, PVD.    PT Comments    Pt able to navigate 4 stairs with railing with SBA and also ambulate around nursing loop with RW modified independently.  Pt reported feeling well and no c/o nausea during session.  Pain L knee 4/10 beginning, during, and end of session.  Pt appears safe to discharge home when medically appropriate; nursing notified.    Follow Up Recommendations  Home health PT     Equipment Recommendations  Rolling walker with 5" wheels    Recommendations for Other Services       Precautions / Restrictions Precautions Precautions: Knee;Fall Precaution Booklet Issued: Yes (comment) Restrictions Weight Bearing Restrictions: Yes LLE Weight Bearing: Weight bearing as tolerated    Mobility  Bed Mobility Overal bed mobility: Modified Independent Bed Mobility: Supine to Sit     Supine to sit: Modified independent (Device/Increase time)   General bed mobility comments: no difficulties noted  Transfers Overall transfer level: Modified independent Equipment used: Rolling walker (2 wheeled) Transfers: Sit to/from [facility] Sit to Stand: Modified independent (Device/Increase time) Stand pivot transfers: Modified independent (Device/Increase time)       General transfer comment: steady strong transfers with RW  Ambulation/Gait Ambulation/Gait assistance: Modified independent (Device/Increase time) Gait Distance (Feet): (120 feet; 200 feet) Assistive device: Rolling walker (2 wheeled)   Gait velocity: decreased   General Gait Details: decreased stance time L LE; increased UE support through RW to offweight L LE; steady with RW; initial vc's to stay closer to [employer] Stairs:  Yes Stairs assistance: Supervision Stair Management: One rail Left;Step to pattern;Sideways Number of Stairs: 4 General stair comments: initial vc's and demo for technique and then pt able to perform on own without any further cueing   Wheelchair Mobility    Modified Rankin (Stroke Patients Only)       Balance Overall balance assessment: Needs assistance Sitting-balance support: No upper extremity supported;Feet supported Sitting balance-Leahy Scale: Normal Sitting balance - Comments: steady sitting reaching outside BOS   Standing balance support: No upper extremity supported Standing balance-Leahy Scale: Good Standing balance comment: steady standing reaching within BOS                            Cognition Arousal/Alertness: Awake/alert Behavior During Therapy: WFL for tasks assessed/performed Overall Cognitive Status: Within Functional Limits for tasks assessed                                        Exercises Total Joint Exercises Goniometric ROM (performed in AM session): L knee extension AROM 5 degrees short of neutral semi-supine in bed; L knee flexion 92 degrees AROM in sitting    General Comments  Pt agreeable to PT session.      Pertinent Vitals/Pain Pain Assessment: 0-10 Pain Score: 4  Pain Location: L knee Pain Descriptors / Indicators: Sore Pain Intervention(s): Limited activity within patient's tolerance;Monitored during session;Premedicated before session;Repositioned;Other (comment)(polar care applied and activated)    Home Living  Prior Function            PT Goals (current goals can now be found in the care plan section) Acute Rehab PT Goals Patient Stated Goal: to go home PT Goal Formulation: With patient Time For Goal Achievement: 11/15/17 Potential to Achieve Goals: Good Progress towards PT goals: Progressing toward goals    Frequency    BID      PT Plan Current plan  remains appropriate    Co-evaluation              AM-PAC PT "6 Clicks" Daily Activity  Outcome Measure  Difficulty turning over in bed (including adjusting bedclothes, sheets and blankets)?: None Difficulty moving from lying on back to sitting on the side of the bed? : None Difficulty sitting down on and standing up from a chair with arms (e.g., wheelchair, bedside commode, etc,.)?: None Help needed moving to and from a bed to chair (including a wheelchair)?: None Help needed walking in hospital room?: None Help needed climbing 3-5 steps with a railing? : A Little 6 Click Score: 23    End of Session Equipment Utilized During Treatment: Gait belt Activity Tolerance: Patient tolerated treatment well Patient left: in chair;with call bell/phone within reach;with chair alarm set;Other (comment)(B heels elevated via towel rolls; polar care applied and activated) Nurse Communication: Mobility status;Precautions;Weight bearing status PT Visit Diagnosis: Other abnormalities of gait and mobility (R26.89);Muscle weakness (generalized) (M62.81);Difficulty in walking, not elsewhere classified (R26.2);Pain Pain - Right/Left: Left Pain - part of body: Knee     Time: 1210-1225 PT Time Calculation (min) (ACUTE ONLY): 15 min  Charges:  $Gait Training: 8-22 mins                   G CodesLeitha Bleak, PT 11/02/17, 12:44 PM [phone]

## 2017-11-02 NOTE — Progress Notes (Signed)
  Subjective: 2 Days Post-Op Procedure(s) (LRB): TOTAL KNEE ARTHROPLASTY (Left) Patient reports pain as mild.   Patient is well, and has had no acute complaints or problems Plan is to go Home after hospital stay. Negative for chest pain and shortness of breath Fever: no Gastrointestinal:Negative for nausea and vomiting  Objective: Vital signs in last 24 hours: Temp:  [98.2 F (36.8 C)-98.9 F (37.2 C)] 98.2 F (36.8 C) (06/13 0732) Pulse Rate:  [70-79] 74 (06/13 0732) Resp:  [18] 18 (06/13 0732) BP: (131-134)/(79-97) 131/79 (06/13 0732) SpO2:  [91 %-96 %] 91 % (06/13 0732)  Intake/Output from previous day:  Intake/Output Summary (Last 24 hours) at 11/02/2017 0758 Last data filed at 11/02/2017 0731 Gross per 24 hour  Intake 480 ml  Output 1200 ml  Net -720 ml    Intake/Output this shift: Total I/O In: -  Out: 300 [Urine:300]  Labs: Recent Labs    11/01/17 0331  HGB 14.4   Recent Labs    11/01/17 0331  WBC 4.9  RBC 4.49  HCT 41.3  PLT 125*   Recent Labs    11/01/17 0331 11/02/17 0413  NA 135 135  K 3.5 3.5  CL 105 105  CO2 24 25  BUN 11 11  CREATININE 0.77 0.78  GLUCOSE 107* 102*  CALCIUM 8.2* 8.3*   No results for input(s): LABPT, INR in the last 72 hours.   EXAM General - Patient is Alert, Appropriate and Oriented Extremity - ABD soft Sensation intact distally Intact pulses distally Dorsiflexion/Plantar flexion intact Incision: scant drainage No cellulitis present Dressing/Incision - blood tinged drainage Motor Function - intact, moving foot and toes well on exam.  Abdomen is soft with normal BS.  Pt is passing gas without difficulty.  Past Medical History:  Diagnosis Date  . Angina pectoris ([place])   . Atrial fibrillation ([place])   . Hyperlipidemia   . Myocardial infarction ([place])   . PAC (premature atrial contraction)   . Tachy-brady syndrome ([place])   . Vertebral artery occlusion, left     Assessment/Plan: 2 Days Post-Op Procedure(s)  (LRB): TOTAL KNEE ARTHROPLASTY (Left) Active Problems:   Status post total knee replacement using cement, left  Estimated body mass index is 30.02 kg/m as calculated from the following:   Height as of this encounter: 5\' 6"  (1.676 m).   Weight as of this encounter: 84.4 kg (186 lb). Advance diet Up with therapy   Labs reviewed this AM. Was able to walk 120 feet yesterday with PT, work on stairs this AM. Pt is passing gas with a soft abdomen.  Continue to work on having BM today. Plan will be for discharge home following session with PT and working on stairs.  DVT Prophylaxis - Lovenox, Foot Pumps and TED hose Weight-Bearing as tolerated to left leg  J. Cameron Proud, PA-C [facility] Orthopaedic Surgery 11/02/2017, 7:58 AM

## 2017-11-02 NOTE — Discharge Summary (Addendum)
Physician Discharge Summary  Patient ID: Stephen Erickson MRN: [id number] DOB/AGE: 23-Apr-1956 62 y.o.  Admit date: 10/31/2017 Discharge date: 11/02/2017  Admission Diagnoses:  post traumatic osteoarthritis of left knee  Discharge Diagnoses: Patient Active Problem List   Diagnosis Date Noted  . Status post total knee replacement using cement, left 10/31/2017    Past Medical History:  Diagnosis Date  . Angina pectoris ([place])   . Atrial fibrillation ([place])   . Hyperlipidemia   . Myocardial infarction ([place])   . PAC (premature atrial contraction)   . Tachy-brady syndrome ([place])   . Vertebral artery occlusion, left    Transfusion: None.   Consultants (if any): Treatment Team:  Leandrew Koyanagi, MD  Discharged Condition: Improved  Hospital Course: TRYTON BODI is an 62 y.o. male who was admitted 10/31/2017 with a diagnosis of post-traumatic osteoarthritis of the left knee and went to the operating room on 10/31/2017 and underwent the above named procedures.    Surgeries: Procedure(s): TOTAL KNEE ARTHROPLASTY on 10/31/2017 Patient tolerated the surgery well. Taken to PACU where she was stabilized and then transferred to the orthopedic floor.  Started on Lovenox 40mg  q 24 hrs. Foot pumps applied bilaterally at 80 mm. Heels elevated on bed with rolled towels. No evidence of DVT. Negative [place]. Physical therapy started on day #1 for gait training and transfer. OT started day #1 for ADL and assisted devices.  Patient's IV was removed on POD1 and foley was removed shortly following surgery.  Implants: Left TKA using all-cemented [employer] Vanguard system with a 70 mm PCR femur, a 75 mm tibial tray with a 16 mm AS E-poly insert, and a 34 x 8.5 mm all-poly 3-pegged domed patella.  He was given perioperative antibiotics:  Anti-infectives (From admission, onward)   Start     Dose/Rate Route Frequency Ordered Stop   10/31/17 1400  ceFAZolin (ANCEF) IVPB 2g/100 mL premix     2  g 200 mL/hr over 30 Minutes Intravenous Every 6 hours 10/31/17 1140 11/01/17 0258   10/31/17 0551  ceFAZolin (ANCEF) 2-4 GM/100ML-% IVPB    Note to Pharmacy:  Register, Karen   : cabinet override      10/31/17 0551 10/31/17 0751   10/30/17 2300  ceFAZolin (ANCEF) IVPB 2g/100 mL premix     2 g 200 mL/hr over 30 Minutes Intravenous  Once 10/30/17 2257 10/31/17 0803    .  He was given sequential compression devices, early ambulation, and lovenox for DVT prophylaxis.  He benefited maximally from the hospital stay and there were no complications.    Recent vital signs:  Vitals:   11/02/17 0004 11/02/17 0732  BP: (!) 133/94 131/79  Pulse: 79 74  Resp: 18 18  Temp: 98.9 F (37.2 C) 98.2 F (36.8 C)  SpO2: 91% 91%    Recent laboratory studies:  Lab Results  Component Value Date   HGB 14.4 11/01/2017   HGB 17.4 10/18/2017   HGB 17.6 09/07/2011   Lab Results  Component Value Date   WBC 4.9 11/01/2017   PLT 125 (L) 11/01/2017   Lab Results  Component Value Date   INR 0.93 10/18/2017   Lab Results  Component Value Date   NA 135 11/02/2017   K 3.5 11/02/2017   CL 105 11/02/2017   CO2 25 11/02/2017   BUN 11 11/02/2017   CREATININE 0.78 11/02/2017   GLUCOSE 102 (H) 11/02/2017    Discharge Medications:   Allergies as of 11/02/2017  Reactions   Lisinopril Cough      Medication List    TAKE these medications   aspirin 81 MG chewable tablet Chew 81 mg by mouth daily.   atorvastatin 80 MG tablet Commonly known as:  LIPITOR Take 80 mg by mouth at bedtime.   diltiazem 120 MG 24 hr tablet Commonly known as:  CARDIZEM LA Take 120 mg by mouth daily.   ELIQUIS 5 MG Tabs tablet Generic drug:  apixaban Take 5 mg by mouth 2 (two) times daily.   metoprolol succinate 25 MG 24 hr tablet Commonly known as:  TOPROL-XL Take 25 mg by mouth daily.   oxyCODONE 5 MG immediate release tablet Commonly known as:  Oxy IR/ROXICODONE Take 1-2 tablets (5-10 mg total) by mouth  every 4 (four) hours as needed for moderate pain.   sotalol 80 MG tablet Commonly known as:  BETAPACE Take 80 mg by mouth 2 (two) times daily.       Diagnostic Studies: Dg Chest 2 View  Result Date: 10/18/2017 CLINICAL DATA:  Preop knee surgery EXAM: CHEST - 2 VIEW COMPARISON:  08/25/2008 FINDINGS: Scarring in the right upper lobe. Heart is upper limits normal in size. No acute confluent airspace opacities or effusions. No acute bony abnormality. IMPRESSION: No active cardiopulmonary disease. Electronically Signed   By: Rolm Baptise M.D.   On: 10/18/2017 10:33   Dg Knee Left Port  Result Date: 10/31/2017 CLINICAL DATA:  Status post left total knee replacement. EXAM: PORTABLE LEFT KNEE - 1-2 VIEW COMPARISON:  04/24/2012 FINDINGS: Sequelae of total knee arthroplasty are identified. Gas is noted in the knee joint, and skin staples are in place. No acute fracture or dislocation is identified. IMPRESSION: Left total knee arthroplasty without evidence of immediate complication. Electronically Signed   By: Logan Bores M.D.   On: 10/31/2017 10:42   Disposition: Discharge disposition: 01-Home or Self Care     Plan will be for discharge home this morning following PT.  Lovenox has been called into the patients pharmacy by care management.  Follow-up Information    Lattie Corns, PA-C Follow up in 14 day(s).   Specialty:  Physician Assistant Why:  Electa Sniff information: [place] [address] [phone]          Signed: Judson Roch PA-C 11/02/2017, 9:33 AM

## 2017-11-02 NOTE — Discharge Instructions (Signed)

## 2017-11-02 NOTE — Care Management Note (Addendum)
Case Management Note  Patient Details  Name: Stephen Erickson MRN: [id number] Date of Birth: July 31, 1955  Subjective/Objective:  Discharging today                  Action/Plan: Spoke with Dr. Roland Rack and [place], [place]. regarding Eliquis and Enoxaparin duel orders. Enoxaparin cancelled. RNCM called pharmacy and cancelled medication. Patient will discharge on Eliquis only. Patient updated  Expected Discharge Date:  11/02/17               Expected Discharge Plan:  [place]  In-House Referral:     Discharge planning Services  CM Consult  Post Acute Care Choice:  Home Health Choice offered to:  Patient  DME Arranged:    DME Agency:     HH Arranged:  PT [place]:  [facility] [employer])  Status of Service:  Completed, signed off  If discussed at [employer], dates discussed:    Additional Comments:  [name], RN 11/02/2017, 9:15 AM

## 2017-11-02 NOTE — Progress Notes (Signed)
Physical Therapy Treatment Patient Details Name: Stephen Erickson MRN: [id number] DOB: 01-04-1956 Today's Date: 11/02/2017    History of Present Illness Pt is a 62 y.o. 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  PMH includes htn, h/o MI, (+) cardiac stents, PVD.    PT Comments    Pt able to ambulate 120 feet with RW CGA to SBA with some cueing for gait technique.  Pt sat down after ambulating and reporting nausea; pt initially wanting to trial stairs but then requesting to return to bed d/t nausea (pt assisted to moveable recliner and brought back to room); pt reporting not eating breakfast and had taken medicine this morning (MD present during session and notified; RN also notified).  Pain 6/10 L knee beginning of session and 4/10 end of session resting in bed.  Will trial stairs next session and increase ambulation distance per pt tolerance prior to discharge.     Follow Up Recommendations  Home health PT     Equipment Recommendations  Rolling walker with 5" wheels    Recommendations for Other Services       Precautions / Restrictions Precautions Precautions: Knee;Fall Precaution Booklet Issued: Yes (comment) Restrictions Weight Bearing Restrictions: Yes LLE Weight Bearing: Weight bearing as tolerated    Mobility  Bed Mobility Overal bed mobility: Modified Independent Bed Mobility: Supine to Sit;Sit to Supine     Supine to sit: Modified independent (Device/Increase time) Sit to supine: Modified independent (Device/Increase time)   General bed mobility comments: no difficulties noted  Transfers Overall transfer level: Modified independent Equipment used: Rolling walker (2 wheeled) Transfers: Sit to/from [facility] Sit to Stand: Modified independent (Device/Increase time) Stand pivot transfers: Modified independent (Device/Increase time)       General transfer comment: steady strong transfers with RW  Ambulation/Gait Ambulation/Gait assistance:  Min guard;Supervision Ambulation Distance (Feet): 120 Feet Assistive device: Rolling walker (2 wheeled)   Gait velocity: decreased   General Gait Details: decreased stance time L LE; increased UE support through RW to offweight L LE; vc's for gait technique and walker use occasionally   Stairs Stairs: (deferred d/t pt's nausea)           Wheelchair Mobility    Modified Rankin (Stroke Patients Only)       Balance Overall balance assessment: Needs assistance Sitting-balance support: No upper extremity supported;Feet supported Sitting balance-Leahy Scale: Normal Sitting balance - Comments: steady sitting reaching outside BOS   Standing balance support: No upper extremity supported Standing balance-Leahy Scale: Good Standing balance comment: steady standing reaching within BOS                            Cognition Arousal/Alertness: Awake/alert Behavior During Therapy: WFL for tasks assessed/performed Overall Cognitive Status: Within Functional Limits for tasks assessed                                        Exercises Total Joint Exercises Long Arc Quad: AROM;Strengthening;Left;10 reps;Seated Knee Flexion: AROM;Strengthening;Left;10 reps;Seated Goniometric ROM: L knee extension AROM 5 degrees short of neutral semi-supine in bed; L knee flexion 92 degrees AROM in sitting General Exercises - Lower Extremity Hip Flexion/Marching: AROM;Strengthening;Left;10 reps;Seated    General Comments   Nursing cleared pt for participation in physical therapy.  Pt agreeable to PT session.      Pertinent Vitals/Pain Pain Assessment: 0-10  Pain Score: 4 (6/10 beginning of session; 4/10 end of session) Pain Location: L knee Pain Descriptors / Indicators: Sore Pain Intervention(s): Limited activity within patient's tolerance;Monitored during session;Premedicated before session;Repositioned;Other (comment)(polar care applied and activated)     Home Living                       Prior Function            PT Goals (current goals can now be found in the care plan section) Acute Rehab PT Goals Patient Stated Goal: to go home PT Goal Formulation: With patient Time For Goal Achievement: 11/15/17 Potential to Achieve Goals: Good Progress towards PT goals: Progressing toward goals    Frequency    BID      PT Plan Current plan remains appropriate    Co-evaluation              AM-PAC PT "6 Clicks" Daily Activity  Outcome Measure  Difficulty turning over in bed (including adjusting bedclothes, sheets and blankets)?: None Difficulty moving from lying on back to sitting on the side of the bed? : None Difficulty sitting down on and standing up from a chair with arms (e.g., wheelchair, bedside commode, etc,.)?: A Little Help needed moving to and from a bed to chair (including a wheelchair)?: None Help needed walking in hospital room?: A Little Help needed climbing 3-5 steps with a railing? : A Little 6 Click Score: 21    End of Session Equipment Utilized During Treatment: Gait belt Activity Tolerance: Other (comment)(Limited d/t nausea) Patient left: in bed;with call bell/phone within reach;with bed alarm set;with SCD's reapplied;Other (comment)(B heels elevated via towel rolls; polar care in place and activated) Nurse Communication: Mobility status;Precautions;Weight bearing status;Other (comment)(Pt's nausea) PT Visit Diagnosis: Other abnormalities of gait and mobility (R26.89);Muscle weakness (generalized) (M62.81);Difficulty in walking, not elsewhere classified (R26.2);Pain Pain - Right/Left: Left Pain - part of body: Knee     Time: 1275-1700 PT Time Calculation (min) (ACUTE ONLY): 23 min  Charges:  $Gait Training: 8-22 mins $Therapeutic Exercise: 8-22 mins                    G CodesLeitha Bleak, PT 11/02/17, 10:03 AM [phone]

## 2017-11-03 DIAGNOSIS — I252 Old myocardial infarction: Secondary | ICD-10-CM | POA: Diagnosis not present

## 2017-11-03 DIAGNOSIS — I209 Angina pectoris, unspecified: Secondary | ICD-10-CM | POA: Diagnosis not present

## 2017-11-03 DIAGNOSIS — I4891 Unspecified atrial fibrillation: Secondary | ICD-10-CM | POA: Diagnosis not present

## 2017-11-03 DIAGNOSIS — Z9181 History of falling: Secondary | ICD-10-CM | POA: Diagnosis not present

## 2017-11-03 DIAGNOSIS — Z7901 Long term (current) use of anticoagulants: Secondary | ICD-10-CM | POA: Diagnosis not present

## 2017-11-03 DIAGNOSIS — Z471 Aftercare following joint replacement surgery: Secondary | ICD-10-CM | POA: Diagnosis not present

## 2017-11-03 DIAGNOSIS — Z96652 Presence of left artificial knee joint: Secondary | ICD-10-CM | POA: Diagnosis not present

## 2017-11-03 DIAGNOSIS — I495 Sick sinus syndrome: Secondary | ICD-10-CM | POA: Diagnosis not present

## 2017-11-06 DIAGNOSIS — Z9181 History of falling: Secondary | ICD-10-CM | POA: Diagnosis not present

## 2017-11-06 DIAGNOSIS — I252 Old myocardial infarction: Secondary | ICD-10-CM | POA: Diagnosis not present

## 2017-11-06 DIAGNOSIS — Z471 Aftercare following joint replacement surgery: Secondary | ICD-10-CM | POA: Diagnosis not present

## 2017-11-06 DIAGNOSIS — Z7901 Long term (current) use of anticoagulants: Secondary | ICD-10-CM | POA: Diagnosis not present

## 2017-11-06 DIAGNOSIS — I209 Angina pectoris, unspecified: Secondary | ICD-10-CM | POA: Diagnosis not present

## 2017-11-06 DIAGNOSIS — Z96652 Presence of left artificial knee joint: Secondary | ICD-10-CM | POA: Diagnosis not present

## 2017-11-06 DIAGNOSIS — I4891 Unspecified atrial fibrillation: Secondary | ICD-10-CM | POA: Diagnosis not present

## 2017-11-06 DIAGNOSIS — I495 Sick sinus syndrome: Secondary | ICD-10-CM | POA: Diagnosis not present

## 2017-11-09 DIAGNOSIS — I209 Angina pectoris, unspecified: Secondary | ICD-10-CM | POA: Diagnosis not present

## 2017-11-09 DIAGNOSIS — Z7901 Long term (current) use of anticoagulants: Secondary | ICD-10-CM | POA: Diagnosis not present

## 2017-11-09 DIAGNOSIS — Z96652 Presence of left artificial knee joint: Secondary | ICD-10-CM | POA: Diagnosis not present

## 2017-11-09 DIAGNOSIS — I252 Old myocardial infarction: Secondary | ICD-10-CM | POA: Diagnosis not present

## 2017-11-09 DIAGNOSIS — I4891 Unspecified atrial fibrillation: Secondary | ICD-10-CM | POA: Diagnosis not present

## 2017-11-09 DIAGNOSIS — Z471 Aftercare following joint replacement surgery: Secondary | ICD-10-CM | POA: Diagnosis not present

## 2017-11-09 DIAGNOSIS — Z9181 History of falling: Secondary | ICD-10-CM | POA: Diagnosis not present

## 2017-11-09 DIAGNOSIS — I495 Sick sinus syndrome: Secondary | ICD-10-CM | POA: Diagnosis not present

## 2017-11-13 DIAGNOSIS — Z7901 Long term (current) use of anticoagulants: Secondary | ICD-10-CM | POA: Diagnosis not present

## 2017-11-13 DIAGNOSIS — I209 Angina pectoris, unspecified: Secondary | ICD-10-CM | POA: Diagnosis not present

## 2017-11-13 DIAGNOSIS — I495 Sick sinus syndrome: Secondary | ICD-10-CM | POA: Diagnosis not present

## 2017-11-13 DIAGNOSIS — Z471 Aftercare following joint replacement surgery: Secondary | ICD-10-CM | POA: Diagnosis not present

## 2017-11-13 DIAGNOSIS — Z96652 Presence of left artificial knee joint: Secondary | ICD-10-CM | POA: Diagnosis not present

## 2017-11-13 DIAGNOSIS — I4891 Unspecified atrial fibrillation: Secondary | ICD-10-CM | POA: Diagnosis not present

## 2017-11-13 DIAGNOSIS — Z9181 History of falling: Secondary | ICD-10-CM | POA: Diagnosis not present

## 2017-11-13 DIAGNOSIS — I252 Old myocardial infarction: Secondary | ICD-10-CM | POA: Diagnosis not present

## 2017-11-15 DIAGNOSIS — M25662 Stiffness of left knee, not elsewhere classified: Secondary | ICD-10-CM | POA: Diagnosis not present

## 2017-11-15 DIAGNOSIS — M6281 Muscle weakness (generalized): Secondary | ICD-10-CM | POA: Diagnosis not present

## 2017-11-15 DIAGNOSIS — Z96652 Presence of left artificial knee joint: Secondary | ICD-10-CM | POA: Diagnosis not present

## 2017-11-17 DIAGNOSIS — M25662 Stiffness of left knee, not elsewhere classified: Secondary | ICD-10-CM | POA: Diagnosis not present

## 2017-11-17 DIAGNOSIS — Z96652 Presence of left artificial knee joint: Secondary | ICD-10-CM | POA: Diagnosis not present

## 2017-11-17 DIAGNOSIS — M6281 Muscle weakness (generalized): Secondary | ICD-10-CM | POA: Diagnosis not present

## 2017-11-21 DIAGNOSIS — M6281 Muscle weakness (generalized): Secondary | ICD-10-CM | POA: Diagnosis not present

## 2017-11-21 DIAGNOSIS — Z96652 Presence of left artificial knee joint: Secondary | ICD-10-CM | POA: Diagnosis not present

## 2017-11-21 DIAGNOSIS — M25662 Stiffness of left knee, not elsewhere classified: Secondary | ICD-10-CM | POA: Diagnosis not present

## 2017-11-22 DIAGNOSIS — Z471 Aftercare following joint replacement surgery: Secondary | ICD-10-CM | POA: Diagnosis not present

## 2017-11-24 DIAGNOSIS — Z96652 Presence of left artificial knee joint: Secondary | ICD-10-CM | POA: Diagnosis not present

## 2017-11-24 DIAGNOSIS — M6281 Muscle weakness (generalized): Secondary | ICD-10-CM | POA: Diagnosis not present

## 2017-11-24 DIAGNOSIS — M25662 Stiffness of left knee, not elsewhere classified: Secondary | ICD-10-CM | POA: Diagnosis not present

## 2017-11-24 DIAGNOSIS — M25562 Pain in left knee: Secondary | ICD-10-CM | POA: Diagnosis not present

## 2017-11-29 DIAGNOSIS — Z96652 Presence of left artificial knee joint: Secondary | ICD-10-CM | POA: Diagnosis not present

## 2017-11-29 DIAGNOSIS — M25662 Stiffness of left knee, not elsewhere classified: Secondary | ICD-10-CM | POA: Diagnosis not present

## 2017-11-29 DIAGNOSIS — M6281 Muscle weakness (generalized): Secondary | ICD-10-CM | POA: Diagnosis not present

## 2017-11-29 DIAGNOSIS — M25562 Pain in left knee: Secondary | ICD-10-CM | POA: Diagnosis not present

## 2017-12-06 DIAGNOSIS — M6281 Muscle weakness (generalized): Secondary | ICD-10-CM | POA: Diagnosis not present

## 2017-12-06 DIAGNOSIS — M25662 Stiffness of left knee, not elsewhere classified: Secondary | ICD-10-CM | POA: Diagnosis not present

## 2017-12-06 DIAGNOSIS — M25562 Pain in left knee: Secondary | ICD-10-CM | POA: Diagnosis not present

## 2017-12-06 DIAGNOSIS — Z96652 Presence of left artificial knee joint: Secondary | ICD-10-CM | POA: Diagnosis not present

## 2017-12-08 DIAGNOSIS — M25662 Stiffness of left knee, not elsewhere classified: Secondary | ICD-10-CM | POA: Diagnosis not present

## 2017-12-08 DIAGNOSIS — M6281 Muscle weakness (generalized): Secondary | ICD-10-CM | POA: Diagnosis not present

## 2017-12-08 DIAGNOSIS — M25562 Pain in left knee: Secondary | ICD-10-CM | POA: Diagnosis not present

## 2017-12-13 DIAGNOSIS — Z96652 Presence of left artificial knee joint: Secondary | ICD-10-CM | POA: Diagnosis not present

## 2018-01-18 DIAGNOSIS — I480 Paroxysmal atrial fibrillation: Secondary | ICD-10-CM | POA: Diagnosis not present

## 2018-01-24 DIAGNOSIS — M1731 Unilateral post-traumatic osteoarthritis, right knee: Secondary | ICD-10-CM | POA: Diagnosis not present

## 2018-01-30 DIAGNOSIS — Z471 Aftercare following joint replacement surgery: Secondary | ICD-10-CM | POA: Diagnosis not present

## 2018-02-19 DIAGNOSIS — M170 Bilateral primary osteoarthritis of knee: Secondary | ICD-10-CM | POA: Diagnosis not present

## 2018-02-19 DIAGNOSIS — Z125 Encounter for screening for malignant neoplasm of prostate: Secondary | ICD-10-CM | POA: Diagnosis not present

## 2018-02-19 DIAGNOSIS — E782 Mixed hyperlipidemia: Secondary | ICD-10-CM | POA: Diagnosis not present

## 2018-02-19 DIAGNOSIS — Z79899 Other long term (current) drug therapy: Secondary | ICD-10-CM | POA: Diagnosis not present

## 2018-02-19 DIAGNOSIS — I2510 Atherosclerotic heart disease of native coronary artery without angina pectoris: Secondary | ICD-10-CM | POA: Diagnosis not present

## 2018-02-19 DIAGNOSIS — E669 Obesity, unspecified: Secondary | ICD-10-CM | POA: Diagnosis not present

## 2018-02-20 ENCOUNTER — Encounter
Admission: RE | Admit: 2018-02-20 | Discharge: 2018-02-20 | Disposition: A | Discharge status: Home / Self Care | Payer: PPO | Source: Ambulatory Visit | Attending: Surgery | Admitting: Surgery

## 2018-02-20 ENCOUNTER — Ambulatory Visit
Admission: RE | Admit: 2018-02-20 | Discharge: 2018-02-20 | Disposition: A | Discharge status: Home / Self Care | Payer: PPO | Source: Ambulatory Visit | Attending: Surgery | Admitting: Surgery

## 2018-02-20 ENCOUNTER — Other Ambulatory Visit: Payer: Self-pay

## 2018-02-20 DIAGNOSIS — I4891 Unspecified atrial fibrillation: Secondary | ICD-10-CM | POA: Diagnosis not present

## 2018-02-20 DIAGNOSIS — I10 Essential (primary) hypertension: Secondary | ICD-10-CM | POA: Insufficient documentation

## 2018-02-20 DIAGNOSIS — E785 Hyperlipidemia, unspecified: Secondary | ICD-10-CM | POA: Diagnosis not present

## 2018-02-20 DIAGNOSIS — Z01818 Encounter for other preprocedural examination: Secondary | ICD-10-CM | POA: Insufficient documentation

## 2018-02-20 DIAGNOSIS — I252 Old myocardial infarction: Secondary | ICD-10-CM | POA: Insufficient documentation

## 2018-02-20 DIAGNOSIS — I2510 Atherosclerotic heart disease of native coronary artery without angina pectoris: Secondary | ICD-10-CM | POA: Insufficient documentation

## 2018-02-20 DIAGNOSIS — M1711 Unilateral primary osteoarthritis, right knee: Secondary | ICD-10-CM | POA: Diagnosis not present

## 2018-02-20 DIAGNOSIS — Z79899 Other long term (current) drug therapy: Secondary | ICD-10-CM | POA: Insufficient documentation

## 2018-02-20 DIAGNOSIS — Z01812 Encounter for preprocedural laboratory examination: Secondary | ICD-10-CM | POA: Diagnosis not present

## 2018-02-20 HISTORY — DX: Unspecified osteoarthritis, unspecified site: M19.90

## 2018-02-20 HISTORY — DX: Cardiac arrhythmia, unspecified: I49.9

## 2018-02-20 HISTORY — DX: Pneumonia, unspecified organism: J18.9

## 2018-02-20 HISTORY — DX: Atherosclerotic heart disease of native coronary artery without angina pectoris: I25.10

## 2018-02-20 HISTORY — DX: Essential (primary) hypertension: I10

## 2018-02-20 LAB — TYPE AND SCREEN
ABO/RH(D): B POS
ANTIBODY SCREEN: NEGATIVE

## 2018-02-20 LAB — URINALYSIS, ROUTINE W REFLEX MICROSCOPIC
Bilirubin Urine: NEGATIVE
GLUCOSE, UA: NEGATIVE mg/dL
HGB URINE DIPSTICK: NEGATIVE
Ketones, ur: NEGATIVE mg/dL
Leukocytes, UA: NEGATIVE
Nitrite: NEGATIVE
PROTEIN: NEGATIVE mg/dL
Specific Gravity, Urine: 1.015 (ref 1.005–1.030)
pH: 5 (ref 5.0–8.0)

## 2018-02-20 LAB — CBC
HEMATOCRIT: 45.9 % (ref 40.0–52.0)
Hemoglobin: 16.1 g/dL (ref 13.0–18.0)
MCH: 31.8 pg (ref 26.0–34.0)
MCHC: 35 g/dL (ref 32.0–36.0)
MCV: 90.9 fL (ref 80.0–100.0)
Platelets: 211 10*3/uL (ref 150–440)
RBC: 5.05 MIL/uL (ref 4.40–5.90)
RDW: 14.8 % — AB (ref 11.5–14.5)
WBC: 5.6 10*3/uL (ref 3.8–10.6)

## 2018-02-20 LAB — BASIC METABOLIC PANEL
Anion gap: 6 (ref 5–15)
BUN: 11 mg/dL (ref 8–23)
CALCIUM: 9.4 mg/dL (ref 8.9–10.3)
CO2: 29 mmol/L (ref 22–32)
CREATININE: 0.91 mg/dL (ref 0.61–1.24)
Chloride: 104 mmol/L (ref 98–111)
GFR calc non Af Amer: >60 mL/min (ref >60)
GLUCOSE: 88 mg/dL (ref 70–99)
Potassium: 3.7 mmol/L (ref 3.5–5.1)
Sodium: 139 mmol/L (ref 135–145)

## 2018-02-20 LAB — PROTIME-INR
INR: 1.17
Prothrombin Time: 14.8 seconds (ref 11.4–15.2)

## 2018-02-20 LAB — SURGICAL PCR SCREEN
MRSA, PCR: NEGATIVE
Staphylococcus aureus: NEGATIVE

## 2018-02-20 NOTE — Patient Instructions (Signed)
  Your procedure is scheduled on: Tuesday March 06, 2018 Report to Same Day Surgery 2nd floor medical mall ([place] [facility]tr[facility]-take elevator on left to 2nd floor.  Check in with surgery information desk.) To find out your arrival time please call [phone] between 1PM - 3PM on Monday March 05, 2018  Remember: Instructions that are not followed completely may result in serious medical risk, up to and including death, or upon the discretion of your surgeon and anesthesiologist your surgery may need to be rescheduled.    _x___ 1. Do not eat food (including mints, candies, chewing gum) after midnight the night before your procedure. You may drink clear liquids up to 2 hours before you are scheduled to arrive at the hospital for your procedure.  Do not drink clear liquids within 2 hours of your scheduled arrival to the hospital.  Clear liquids include  --Water or Apple juice without pulp  --Clear carbohydrate beverage such as Gatorade  --Black Coffee or Clear Tea (No milk, no creamers, do not add anything to the coffee or tea)    __x__ 2. No Alcohol for 24 hours before or after surgery.   __x__ 3. No Smoking or e-cigarettes for 24 prior to surgery.  Do not use any chewable tobacco products for at least 6 hour prior to surgery   __x__ 4. Notify your doctor if there is any change in your medical condition (cold, fever, infections).   __x__ 5. On the morning of surgery brush your teeth with toothpaste and water.  You may rinse your mouth with mouth wash if you wish.  Do not swallow any toothpaste or mouthwash.  Please read over the following fact sheets that you were given:   [facility] Preparing for Surgery and or MRSA Information    __x__ Use CHG Soap or sage wipes as directed on instruction sheet    Do not wear jewelry.  Do not wear lotions, powders, deodorant, or perfumes.   Do not shave below the face/neck 48 hours prior to surgery.   Do not bring valuables to the hospital.     [facility] is not responsible for any belongings or valuables.               Contacts, dentures or bridgework may not be worn into surgery.  Leave your suitcase in the car. After surgery it may be brought to your room.  For patients admitted to the hospital, discharge time is determined by your treatment team.  _x___ Take anti-hypertensive listed below, cardiac, seizure, asthma, anti-reflux and psychiatric medicines. These include:  1. Diltiazem (Cardizem)  2. Metoprolol (Toprol)  3. Sotalol (Betapace)  _x___ Follow recommendations from Cardiologist, Pulmonologist or PCP regarding stopping Aspirin, Eliquis, Coumadin, Plavix , Effient, or Pradaxa, and Pletal.  _x___ Stop Anti-inflammatories such as Advil, Aleve, Ibuprofen, Motrin, Naproxen, Naprosyn, Goodies powders or aspirin products. OK to take Tylenol and Celebrex.   _x___ NOW: Stop supplements until after surgery.  But may continue Vitamin D, Vitamin B, and multivitamin.

## 2018-03-05 MED ORDER — CEFAZOLIN SODIUM-DEXTROSE 2-4 GM/100ML-% IV SOLN
2.0000 g | Freq: Once | INTRAVENOUS | Status: AC
  Administered 2018-03-06: 2 g via INTRAVENOUS

## 2018-03-05 NOTE — Pre-Procedure Instructions (Signed)
CALL FROM [place] AT DR POGGI. LD ASPIRIN 03/03/18 OK

## 2018-03-05 NOTE — Pre-Procedure Instructions (Signed)
CARDIOLOGY NOTE FROM 01/18/18 ON CHART. NOTE FROM PCP VISIT 02/19/18 CLEARING FOR SURGERY ON CHART. TALKED WITH PATIENT LD ELIQUIS AND ASA 03/03/18. LM WITH INFO GFOR TIFFANY AT DR [facility]

## 2018-03-06 ENCOUNTER — Inpatient Hospital Stay: Payer: PPO | Admitting: Anesthesiology

## 2018-03-06 ENCOUNTER — Other Ambulatory Visit: Payer: Self-pay

## 2018-03-06 ENCOUNTER — Encounter
Admission: RE | Disposition: A | Discharge status: Home Health | Payer: Self-pay | Source: Home / Self Care | Attending: Surgery

## 2018-03-06 ENCOUNTER — Inpatient Hospital Stay
Admission: RE | Admit: 2018-03-06 | Discharge: 2018-03-09 | DRG: 470 | Disposition: A | Discharge status: Home Health | Payer: PPO | Attending: Surgery | Admitting: Surgery

## 2018-03-06 ENCOUNTER — Inpatient Hospital Stay: Payer: PPO

## 2018-03-06 DIAGNOSIS — I480 Paroxysmal atrial fibrillation: Secondary | ICD-10-CM | POA: Diagnosis present

## 2018-03-06 DIAGNOSIS — I10 Essential (primary) hypertension: Secondary | ICD-10-CM | POA: Diagnosis not present

## 2018-03-06 DIAGNOSIS — Z7901 Long term (current) use of anticoagulants: Secondary | ICD-10-CM | POA: Diagnosis not present

## 2018-03-06 DIAGNOSIS — Z23 Encounter for immunization: Secondary | ICD-10-CM

## 2018-03-06 DIAGNOSIS — Z7982 Long term (current) use of aspirin: Secondary | ICD-10-CM

## 2018-03-06 DIAGNOSIS — Z79899 Other long term (current) drug therapy: Secondary | ICD-10-CM | POA: Diagnosis not present

## 2018-03-06 DIAGNOSIS — Z807 Family history of other malignant neoplasms of lymphoid, hematopoietic and related tissues: Secondary | ICD-10-CM | POA: Diagnosis not present

## 2018-03-06 DIAGNOSIS — Z96651 Presence of right artificial knee joint: Secondary | ICD-10-CM

## 2018-03-06 DIAGNOSIS — I252 Old myocardial infarction: Secondary | ICD-10-CM | POA: Diagnosis not present

## 2018-03-06 DIAGNOSIS — Z6830 Body mass index (BMI) 30.0-30.9, adult: Secondary | ICD-10-CM | POA: Diagnosis not present

## 2018-03-06 DIAGNOSIS — Z471 Aftercare following joint replacement surgery: Secondary | ICD-10-CM | POA: Diagnosis not present

## 2018-03-06 DIAGNOSIS — Z96652 Presence of left artificial knee joint: Secondary | ICD-10-CM | POA: Diagnosis not present

## 2018-03-06 DIAGNOSIS — Z888 Allergy status to other drugs, medicaments and biological substances status: Secondary | ICD-10-CM | POA: Diagnosis not present

## 2018-03-06 DIAGNOSIS — I495 Sick sinus syndrome: Secondary | ICD-10-CM | POA: Diagnosis present

## 2018-03-06 DIAGNOSIS — Z87891 Personal history of nicotine dependence: Secondary | ICD-10-CM | POA: Diagnosis not present

## 2018-03-06 DIAGNOSIS — Z8249 Family history of ischemic heart disease and other diseases of the circulatory system: Secondary | ICD-10-CM

## 2018-03-06 DIAGNOSIS — E785 Hyperlipidemia, unspecified: Secondary | ICD-10-CM | POA: Diagnosis present

## 2018-03-06 DIAGNOSIS — Z800 Family history of malignant neoplasm of digestive organs: Secondary | ICD-10-CM

## 2018-03-06 DIAGNOSIS — Z955 Presence of coronary angioplasty implant and graft: Secondary | ICD-10-CM | POA: Diagnosis not present

## 2018-03-06 DIAGNOSIS — M1711 Unilateral primary osteoarthritis, right knee: Secondary | ICD-10-CM | POA: Diagnosis not present

## 2018-03-06 DIAGNOSIS — I2510 Atherosclerotic heart disease of native coronary artery without angina pectoris: Secondary | ICD-10-CM | POA: Diagnosis present

## 2018-03-06 DIAGNOSIS — M79661 Pain in right lower leg: Secondary | ICD-10-CM | POA: Diagnosis not present

## 2018-03-06 DIAGNOSIS — M7989 Other specified soft tissue disorders: Secondary | ICD-10-CM

## 2018-03-06 DIAGNOSIS — E669 Obesity, unspecified: Secondary | ICD-10-CM | POA: Diagnosis not present

## 2018-03-06 DIAGNOSIS — M1731 Unilateral post-traumatic osteoarthritis, right knee: Secondary | ICD-10-CM | POA: Diagnosis not present

## 2018-03-06 HISTORY — PX: TOTAL KNEE ARTHROPLASTY: SHX125

## 2018-03-06 SURGERY — ARTHROPLASTY, KNEE, TOTAL
Anesthesia: Spinal | Laterality: Right

## 2018-03-06 MED ORDER — ASPIRIN 81 MG PO CHEW
81.0000 mg | CHEWABLE_TABLET | Freq: Every day | ORAL | Status: DC
  Administered 2018-03-06 – 2018-03-09 (×4): 81 mg via ORAL
  Filled 2018-03-06 (×4): qty 1

## 2018-03-06 MED ORDER — PANTOPRAZOLE SODIUM 40 MG PO TBEC
40.0000 mg | DELAYED_RELEASE_TABLET | Freq: Every day | ORAL | Status: DC
  Administered 2018-03-06 – 2018-03-09 (×4): 40 mg via ORAL
  Filled 2018-03-06 (×4): qty 1

## 2018-03-06 MED ORDER — KETOROLAC TROMETHAMINE 30 MG/ML IJ SOLN
30.0000 mg | Freq: Once | INTRAMUSCULAR | Status: AC
  Administered 2018-03-06: 30 mg via INTRAVENOUS

## 2018-03-06 MED ORDER — FAMOTIDINE 20 MG PO TABS
20.0000 mg | ORAL_TABLET | Freq: Once | ORAL | Status: AC
  Administered 2018-03-06: 20 mg via ORAL

## 2018-03-06 MED ORDER — ATORVASTATIN CALCIUM 20 MG PO TABS
80.0000 mg | ORAL_TABLET | Freq: Every day | ORAL | Status: DC
  Administered 2018-03-06 – 2018-03-08 (×3): 80 mg via ORAL
  Filled 2018-03-06 (×3): qty 4

## 2018-03-06 MED ORDER — BUPIVACAINE HCL (PF) 0.5 % IJ SOLN
INTRAMUSCULAR | Status: AC
  Filled 2018-03-06: qty 10

## 2018-03-06 MED ORDER — SODIUM CHLORIDE 0.9 % IV SOLN
INTRAVENOUS | Status: DC
  Administered 2018-03-06 – 2018-03-07 (×3): via INTRAVENOUS

## 2018-03-06 MED ORDER — ONDANSETRON HCL 4 MG PO TABS: 4.0000 mg | ORAL_TABLET | Freq: Four times a day (QID) | ORAL | Status: DC | PRN

## 2018-03-06 MED ORDER — NEOMYCIN-POLYMYXIN B GU 40-200000 IR SOLN
Status: AC
  Filled 2018-03-06: qty 20

## 2018-03-06 MED ORDER — KETOROLAC TROMETHAMINE 30 MG/ML IJ SOLN
INTRAMUSCULAR | Status: AC
  Filled 2018-03-06: qty 1

## 2018-03-06 MED ORDER — SODIUM CHLORIDE FLUSH 0.9 % IV SOLN
INTRAVENOUS | Status: AC
  Filled 2018-03-06: qty 40

## 2018-03-06 MED ORDER — PROPOFOL 500 MG/50ML IV EMUL
INTRAVENOUS | Status: AC
  Filled 2018-03-06: qty 50

## 2018-03-06 MED ORDER — LACTATED RINGERS IV SOLN
INTRAVENOUS | Status: DC
  Administered 2018-03-06: 07:00:00 via INTRAVENOUS

## 2018-03-06 MED ORDER — BISACODYL 10 MG RE SUPP: 10.0000 mg | Freq: Every day | RECTAL | Status: DC | PRN

## 2018-03-06 MED ORDER — BUPIVACAINE-EPINEPHRINE (PF) 0.5% -1:200000 IJ SOLN
INTRAMUSCULAR | Status: DC | PRN
  Administered 2018-03-06: 30 mL via PERINEURAL

## 2018-03-06 MED ORDER — BUPIVACAINE LIPOSOME 1.3 % IJ SUSP
INTRAMUSCULAR | Status: AC
  Filled 2018-03-06: qty 20

## 2018-03-06 MED ORDER — DIPHENHYDRAMINE HCL 12.5 MG/5ML PO ELIX: 12.5000 mg | ORAL_SOLUTION | ORAL | Status: DC | PRN

## 2018-03-06 MED ORDER — MAGNESIUM HYDROXIDE 400 MG/5ML PO SUSP
30.0000 mL | Freq: Every day | ORAL | Status: DC | PRN
  Administered 2018-03-07: 30 mL via ORAL
  Filled 2018-03-06 (×2): qty 30

## 2018-03-06 MED ORDER — APIXABAN 5 MG PO TABS
5.0000 mg | ORAL_TABLET | Freq: Two times a day (BID) | ORAL | Status: DC
  Administered 2018-03-07 – 2018-03-09 (×5): 5 mg via ORAL
  Filled 2018-03-06 (×5): qty 1

## 2018-03-06 MED ORDER — OXYCODONE HCL 5 MG PO TABS
5.0000 mg | ORAL_TABLET | ORAL | Status: DC | PRN
  Administered 2018-03-06 – 2018-03-07 (×3): 10 mg via ORAL
  Administered 2018-03-07 (×3): 5 mg via ORAL
  Administered 2018-03-08 (×6): 10 mg via ORAL
  Filled 2018-03-06 (×3): qty 2
  Filled 2018-03-06: qty 1
  Filled 2018-03-06 (×4): qty 2
  Filled 2018-03-06 (×2): qty 1
  Filled 2018-03-06 (×2): qty 2

## 2018-03-06 MED ORDER — ACETAMINOPHEN 500 MG PO TABS
1000.0000 mg | ORAL_TABLET | Freq: Four times a day (QID) | ORAL | Status: AC
  Administered 2018-03-06 – 2018-03-07 (×4): 1000 mg via ORAL
  Filled 2018-03-06 (×4): qty 2

## 2018-03-06 MED ORDER — FENTANYL CITRATE (PF) 100 MCG/2ML IJ SOLN: 25.0000 ug | INTRAMUSCULAR | Status: DC | PRN

## 2018-03-06 MED ORDER — TRAMADOL HCL 50 MG PO TABS
50.0000 mg | ORAL_TABLET | Freq: Four times a day (QID) | ORAL | Status: DC | PRN
  Administered 2018-03-06 – 2018-03-08 (×3): 50 mg via ORAL
  Filled 2018-03-06 (×3): qty 1

## 2018-03-06 MED ORDER — LIDOCAINE HCL (PF) 2 % IJ SOLN
INTRAMUSCULAR | Status: AC
  Filled 2018-03-06: qty 10

## 2018-03-06 MED ORDER — GLYCOPYRROLATE 0.2 MG/ML IJ SOLN
INTRAMUSCULAR | Status: AC
  Filled 2018-03-06: qty 1

## 2018-03-06 MED ORDER — SODIUM CHLORIDE 0.9 % IV SOLN
INTRAVENOUS | Status: DC | PRN
  Administered 2018-03-06: 30 ug/min via INTRAVENOUS

## 2018-03-06 MED ORDER — MIDAZOLAM HCL 2 MG/2ML IJ SOLN
INTRAMUSCULAR | Status: AC
  Filled 2018-03-06: qty 2

## 2018-03-06 MED ORDER — MEPERIDINE HCL 50 MG/ML IJ SOLN: 6.2500 mg | INTRAMUSCULAR | Status: DC | PRN

## 2018-03-06 MED ORDER — MIDAZOLAM HCL 5 MG/5ML IJ SOLN
INTRAMUSCULAR | Status: DC | PRN
  Administered 2018-03-06: 2 mg via INTRAVENOUS

## 2018-03-06 MED ORDER — PROPOFOL 10 MG/ML IV BOLUS
INTRAVENOUS | Status: DC | PRN
  Administered 2018-03-06 (×2): 17 mg via INTRAVENOUS

## 2018-03-06 MED ORDER — TRANEXAMIC ACID 1000 MG/10ML IV SOLN
INTRAVENOUS | Status: AC
  Filled 2018-03-06: qty 10

## 2018-03-06 MED ORDER — PHENYLEPHRINE HCL 10 MG/ML IJ SOLN
INTRAMUSCULAR | Status: AC
  Filled 2018-03-06: qty 1

## 2018-03-06 MED ORDER — FENTANYL CITRATE (PF) 100 MCG/2ML IJ SOLN
INTRAMUSCULAR | Status: AC
  Filled 2018-03-06: qty 2

## 2018-03-06 MED ORDER — TRANEXAMIC ACID 1000 MG/10ML IV SOLN
INTRAVENOUS | Status: DC | PRN
  Administered 2018-03-06: 10 mg via INTRAVENOUS

## 2018-03-06 MED ORDER — METOCLOPRAMIDE HCL 5 MG/ML IJ SOLN: 5.0000 mg | Freq: Three times a day (TID) | INTRAMUSCULAR | Status: DC | PRN

## 2018-03-06 MED ORDER — OXYCODONE HCL 5 MG/5ML PO SOLN: 5.0000 mg | Freq: Once | ORAL | Status: DC | PRN

## 2018-03-06 MED ORDER — ACETAMINOPHEN 325 MG PO TABS
325.0000 mg | ORAL_TABLET | Freq: Four times a day (QID) | ORAL | Status: DC | PRN
  Administered 2018-03-07 – 2018-03-08 (×2): 325 mg via ORAL
  Filled 2018-03-06 (×2): qty 1

## 2018-03-06 MED ORDER — BUPIVACAINE HCL (PF) 0.5 % IJ SOLN
INTRAMUSCULAR | Status: DC | PRN
  Administered 2018-03-06: 3 mL

## 2018-03-06 MED ORDER — FAMOTIDINE 20 MG PO TABS
ORAL_TABLET | ORAL | Status: AC
  Administered 2018-03-06: 20 mg via ORAL
  Filled 2018-03-06: qty 1

## 2018-03-06 MED ORDER — BUPIVACAINE-EPINEPHRINE (PF) 0.5% -1:200000 IJ SOLN
INTRAMUSCULAR | Status: AC
  Filled 2018-03-06: qty 30

## 2018-03-06 MED ORDER — PROPOFOL 500 MG/50ML IV EMUL
INTRAVENOUS | Status: DC | PRN
  Administered 2018-03-06: 60 ug/kg/min via INTRAVENOUS

## 2018-03-06 MED ORDER — OXYCODONE HCL 5 MG PO TABS: 5.0000 mg | ORAL_TABLET | Freq: Once | ORAL | Status: DC | PRN

## 2018-03-06 MED ORDER — SODIUM CHLORIDE 0.9 % IV SOLN
INTRAVENOUS | Status: DC | PRN
  Administered 2018-03-06: 60 mL

## 2018-03-06 MED ORDER — CEFAZOLIN SODIUM-DEXTROSE 2-4 GM/100ML-% IV SOLN
2.0000 g | Freq: Four times a day (QID) | INTRAVENOUS | Status: AC
  Administered 2018-03-06 – 2018-03-07 (×3): 2 g via INTRAVENOUS
  Filled 2018-03-06 (×3): qty 100

## 2018-03-06 MED ORDER — HYDROMORPHONE HCL 1 MG/ML IJ SOLN
0.5000 mg | INTRAMUSCULAR | Status: DC | PRN
  Administered 2018-03-06: 1 mg via INTRAVENOUS
  Filled 2018-03-06: qty 1

## 2018-03-06 MED ORDER — METOCLOPRAMIDE HCL 10 MG PO TABS: 5.0000 mg | ORAL_TABLET | Freq: Three times a day (TID) | ORAL | Status: DC | PRN

## 2018-03-06 MED ORDER — METOPROLOL SUCCINATE ER 25 MG PO TB24
25.0000 mg | ORAL_TABLET | Freq: Every day | ORAL | Status: DC
  Administered 2018-03-08 – 2018-03-09 (×2): 25 mg via ORAL
  Filled 2018-03-06 (×2): qty 1

## 2018-03-06 MED ORDER — CEFAZOLIN SODIUM-DEXTROSE 2-4 GM/100ML-% IV SOLN
INTRAVENOUS | Status: AC
  Filled 2018-03-06: qty 100

## 2018-03-06 MED ORDER — ONDANSETRON HCL 4 MG/2ML IJ SOLN: 4.0000 mg | Freq: Four times a day (QID) | INTRAMUSCULAR | Status: DC | PRN

## 2018-03-06 MED ORDER — FLEET ENEMA 7-19 GM/118ML RE ENEM: 1.0000 | ENEMA | Freq: Once | RECTAL | Status: DC | PRN

## 2018-03-06 MED ORDER — KETOROLAC TROMETHAMINE 15 MG/ML IJ SOLN
15.0000 mg | Freq: Four times a day (QID) | INTRAMUSCULAR | Status: AC
  Administered 2018-03-06 – 2018-03-07 (×4): 15 mg via INTRAVENOUS
  Filled 2018-03-06 (×4): qty 1

## 2018-03-06 MED ORDER — DILTIAZEM HCL ER COATED BEADS 120 MG PO TB24
120.0000 mg | ORAL_TABLET | Freq: Every day | ORAL | Status: DC
  Administered 2018-03-06 – 2018-03-09 (×3): 120 mg via ORAL
  Filled 2018-03-06 (×4): qty 1

## 2018-03-06 MED ORDER — FENTANYL CITRATE (PF) 100 MCG/2ML IJ SOLN
INTRAMUSCULAR | Status: DC | PRN
  Administered 2018-03-06 (×2): 25 ug via INTRAVENOUS
  Administered 2018-03-06: 50 ug via INTRAVENOUS

## 2018-03-06 MED ORDER — SOTALOL HCL 80 MG PO TABS
80.0000 mg | ORAL_TABLET | Freq: Two times a day (BID) | ORAL | Status: DC
  Administered 2018-03-06 – 2018-03-08 (×4): 80 mg via ORAL
  Filled 2018-03-06 (×8): qty 1

## 2018-03-06 MED ORDER — DOCUSATE SODIUM 100 MG PO CAPS
100.0000 mg | ORAL_CAPSULE | Freq: Two times a day (BID) | ORAL | Status: DC
  Administered 2018-03-06 – 2018-03-09 (×4): 100 mg via ORAL
  Filled 2018-03-06 (×5): qty 1

## 2018-03-06 MED ORDER — PROMETHAZINE HCL 25 MG/ML IJ SOLN: 6.2500 mg | INTRAMUSCULAR | Status: DC | PRN

## 2018-03-06 SURGICAL SUPPLY — 60 items
BANDAGE ELASTIC 6 LF NS (GAUZE/BANDAGES/DRESSINGS) ×2 IMPLANT
BEARING TIBIAL VG AS 71X12 (Joint) ×1 IMPLANT
BLADE SAW SAG 25X90X1.19 (BLADE) ×2 IMPLANT
BLADE SURG SZ20 CARB STEEL (BLADE) ×2 IMPLANT
CANISTER SUCT 1200ML W/VALVE (MISCELLANEOUS) ×2 IMPLANT
CANISTER SUCT 3000ML PPV (MISCELLANEOUS) ×2 IMPLANT
CEMENT BONE R 1X40 (Cement) ×4 IMPLANT
CEMENT VACUUM MIXING SYSTEM (MISCELLANEOUS) ×2 IMPLANT
CHLORAPREP W/TINT 26ML (MISCELLANEOUS) ×2 IMPLANT
COOLER POLAR GLACIER W/PUMP (MISCELLANEOUS) ×2 IMPLANT
COVER MAYO STAND STRL (DRAPES) ×2 IMPLANT
COVER WAND RF STERILE (DRAPES) ×2 IMPLANT
CUFF TOURN 24 STER (MISCELLANEOUS) ×2 IMPLANT
CUFF TOURN 30 STER DUAL PORT (MISCELLANEOUS) IMPLANT
DRAPE IMP U-DRAPE 54X76 (DRAPES) ×2 IMPLANT
DRAPE INCISE IOBAN 66X45 STRL (DRAPES) ×2 IMPLANT
DRAPE SHEET LG 3/4 BI-LAMINATE (DRAPES) ×2 IMPLANT
DRSG OPSITE POSTOP 4X10 (GAUZE/BANDAGES/DRESSINGS) ×2 IMPLANT
DRSG OPSITE POSTOP 4X8 (GAUZE/BANDAGES/DRESSINGS) ×2 IMPLANT
ELECT CAUTERY BLADE 6.4 (BLADE) ×2 IMPLANT
ELECT REM PT RETURN 9FT ADLT (ELECTROSURGICAL) ×2
ELECTRODE REM PT RTRN 9FT ADLT (ELECTROSURGICAL) ×1 IMPLANT
FEMORAL COMPONENT 70MM RIGHT (Joint) ×2 IMPLANT
GLOVE BIO SURGEON STRL SZ7.5 (GLOVE) ×8 IMPLANT
GLOVE BIO SURGEON STRL SZ8 (GLOVE) ×8 IMPLANT
GLOVE BIOGEL PI IND STRL 8 (GLOVE) ×1 IMPLANT
GLOVE BIOGEL PI INDICATOR 8 (GLOVE) ×1
GLOVE INDICATOR 8.0 STRL GRN (GLOVE) ×2 IMPLANT
GOWN STRL REUS W/ TWL LRG LVL3 (GOWN DISPOSABLE) ×1 IMPLANT
GOWN STRL REUS W/ TWL XL LVL3 (GOWN DISPOSABLE) ×1 IMPLANT
GOWN STRL REUS W/TWL LRG LVL3 (GOWN DISPOSABLE) ×1
GOWN STRL REUS W/TWL XL LVL3 (GOWN DISPOSABLE) ×1
HOLDER FOLEY CATH W/STRAP (MISCELLANEOUS) ×2 IMPLANT
HOOD PEEL AWAY FLYTE STAYCOOL (MISCELLANEOUS) ×6 IMPLANT
IMMBOLIZER KNEE 19 BLUE UNIV (SOFTGOODS) ×2 IMPLANT
KIT TURNOVER KIT A (KITS) ×2 IMPLANT
NDL SAFETY ECLIPSE 18X1.5 (NEEDLE) ×2 IMPLANT
NEEDLE HYPO 18GX1.5 SHARP (NEEDLE) ×2
NEEDLE SPNL 20GX3.5 QUINCKE YW (NEEDLE) ×2 IMPLANT
NS IRRIG 1000ML POUR BTL (IV SOLUTION) ×2 IMPLANT
PACK TOTAL KNEE (MISCELLANEOUS) ×2 IMPLANT
PAD WRAPON POLAR KNEE (MISCELLANEOUS) ×1 IMPLANT
PATELLA STD 34X8.5 (Orthopedic Implant) ×2 IMPLANT
PLATE KNEE TIBIAL 71MM FIXED (Plate) ×2 IMPLANT
PULSAVAC PLUS IRRIG FAN TIP (DISPOSABLE) ×2
SOL .9 NS 3000ML IRR  AL (IV SOLUTION) ×1
SOL .9 NS 3000ML IRR UROMATIC (IV SOLUTION) ×1 IMPLANT
STAPLER SKIN PROX 35W (STAPLE) ×2 IMPLANT
SUCTION FRAZIER HANDLE 10FR (MISCELLANEOUS) ×1
SUCTION TUBE FRAZIER 10FR DISP (MISCELLANEOUS) ×1 IMPLANT
SUT VIC AB 0 CT1 36 (SUTURE) ×6 IMPLANT
SUT VIC AB 2-0 CT1 27 (SUTURE) ×5
SUT VIC AB 2-0 CT1 TAPERPNT 27 (SUTURE) ×5 IMPLANT
SYR 10ML LL (SYRINGE) ×2 IMPLANT
SYR 20CC LL (SYRINGE) ×2 IMPLANT
SYR 30ML LL (SYRINGE) ×6 IMPLANT
TIBIAL BEARING VG AS 71X12 (Joint) ×2 IMPLANT
TIP FAN IRRIG PULSAVAC PLUS (DISPOSABLE) ×1 IMPLANT
TRAY FOLEY MTR SLVR 16FR STAT (SET/KITS/TRAYS/PACK) ×2 IMPLANT
WRAPON POLAR PAD KNEE (MISCELLANEOUS) ×2

## 2018-03-06 NOTE — Anesthesia Post-op Follow-up Note (Signed)
Anesthesia QCDR form completed.        

## 2018-03-06 NOTE — Transfer of Care (Signed)
Immediate Anesthesia Transfer of Care Note  Patient: Stephen Erickson  Procedure(s) Performed: TOTAL KNEE ARTHROPLASTY (Right )  Patient Location: PACU  Anesthesia Type:Spinal  Level of Consciousness: awake, alert , oriented and patient cooperative  Airway & Oxygen Therapy: Patient Spontanous Breathing and Patient connected to nasal cannula oxygen  Post-op Assessment: Report given to RN and Post -op Vital signs reviewed and stable  Post vital signs: Reviewed and stable  Last Vitals:  Vitals Value Taken Time  BP 108/69 03/06/2018 10:20 AM  Temp    Pulse 64 03/06/2018 10:21 AM  Resp 28 03/06/2018 10:21 AM  SpO2 97 % 03/06/2018 10:21 AM  Vitals shown include unvalidated device data.  Last Pain:  Vitals:   03/06/18 0621  TempSrc: Tympanic  PainSc: 0-No pain         Complications: No apparent anesthesia complications

## 2018-03-06 NOTE — H&P (Signed)
Paper H&P to be scanned into permanent record. H&P reviewed and patient re-examined. No changes. 

## 2018-03-06 NOTE — Anesthesia Preprocedure Evaluation (Signed)
Anesthesia Evaluation  Patient identified by MRN, date of birth, ID band Patient awake    Reviewed: Allergy & Precautions, NPO status , Patient's Chart, lab work & pertinent test results  History of Anesthesia Complications Negative for: history of anesthetic complications  Airway Mallampati: III  TM Distance: >3 FB Neck ROM: Full    Dental  (+) Poor Dentition, Missing   Pulmonary neg pulmonary ROS, neg sleep apnea, neg COPD,    breath sounds clear to auscultation- rhonchi (-) wheezing      Cardiovascular Exercise Tolerance: Good hypertension, Pt. on medications + CAD, + Past MI and + Cardiac Stents  + dysrhythmias Atrial Fibrillation  Rhythm:Regular Rate:Normal - Systolic murmurs and - Diastolic murmurs    Neuro/Psych negative neurological ROS  negative psych ROS   GI/Hepatic negative GI ROS, Neg liver ROS,   Endo/Other  negative endocrine ROSneg diabetes  Renal/GU negative Renal ROS     Musculoskeletal  (+) Arthritis ,   Abdominal (+) + obese,   Peds  Hematology negative hematology ROS (+)   Anesthesia Other Findings Past Medical History: No date: Angina pectoris (H[facility]) No date: Arthritis No date: Atrial fibrillation ([facility]) No date: Coronary artery disease No date: Dysrhythmia No date: Hyperlipidemia No date: Hypertension 2011: Myocardial infarction ([place]) No date: PAC (premature atrial contraction) No date: Pneumonia No date: Tachy-brady syndrome ([facility]) No date: Vertebral artery occlusion, left   Reproductive/Obstetrics                             Lab Results  Component Value Date   WBC 5.6 02/20/2018   HGB 16.1 02/20/2018   HCT 45.9 02/20/2018   MCV 90.9 02/20/2018   PLT 211 02/20/2018    Anesthesia Physical Anesthesia Plan  ASA: III  Anesthesia Plan: Spinal   Post-op Pain Management:    Induction:   PONV Risk Score and Plan: 1 and Propofol infusion  Airway  Management Planned: Natural Airway  Additional Equipment:   Intra-op Plan:   Post-operative Plan:   Informed Consent: I have reviewed the patients History and Physical, chart, labs and discussed the procedure including the risks, benefits and alternatives for the proposed anesthesia with the patient or authorized representative who has indicated his/her understanding and acceptance.   Dental advisory given  Plan Discussed with: CRNA and Anesthesiologist  Anesthesia Plan Comments: (Last dose of eliquis 03/02/18)        Anesthesia Quick Evaluation

## 2018-03-06 NOTE — Progress Notes (Signed)
15 minute call to floor. 

## 2018-03-06 NOTE — Op Note (Signed)
03/06/2018  10:34 AM  Patient:   Stephen Erickson  Pre-Op Diagnosis:   Degenerative joint disease, right knee.  Post-Op Diagnosis:   Same  Procedure:   Right TKA using all-cemented [employer] Vanguard system with a 70 mm PCR femur, a 71 mm tibial tray with a 12 mm AS E-poly insert, and a 34 x 8.5 mm all-poly 3-pegged domed patella.  Surgeon:   Pascal Lux, MD  Assistant:   Cameron Proud, PA-C; Phoebe Sharps, PA-S  Anesthesia:   Spinal  Findings:   As above  Complications:   None  EBL:   10 cc  Fluids:   950 cc crystalloid  UOP:   600 cc  TT:   105 minutes at 300 mmHg  Drains:   None  Closure:   Staples  Implants:   As above  Brief Clinical Note:   The patient is a 62 year old male with a long history of progressively worsening right knee pain. The patient's symptoms have progressed despite medications, activity modification, injections, etc. The patient's history and examination were consistent with advanced degenerative joint disease of the right knee confirmed by plain radiographs. The patient presents at this time for a right total knee arthroplasty.  Procedure:   The patient was brought into the operating room. After adequate spinal anesthesia was obtained, the patient was lain in the supine position. A Foley catheter was placed by the nurse before the right lower extremity was prepped with ChloraPrep solution and draped sterilely. Preoperative antibiotics were administered. After verifying the proper laterality with a surgical timeout, the limb was exsanguinated with an Esmarch and the tourniquet inflated to 300 mmHg. A standard anterior approach to the knee was made through an approximately 7 inch incision. The incision was carried down through the subcutaneous tissues to expose superficial retinaculum. This was split the length of the incision and the medial flap elevated sufficiently to expose the medial retinaculum. The medial retinaculum was incised, leaving a 3-4 mm  cuff of tissue on the patella. This was extended distally along the medial border of the patellar tendon and proximally through the medial third of the quadriceps tendon. A subtotal fat pad excision was performed before the soft tissues were elevated off the anteromedial and anterolateral aspects of the proximal tibia to the level of the collateral ligaments. The anterior portions of the medial and lateral menisci were removed, as was the anterior cruciate ligament. With the knee flexed to 90, the external tibial guide was positioned and the appropriate proximal tibial cut made. This piece was taken to the back table where it was measured and found to be optimally replicated by a 71 mm component.  Attention was directed to the distal femur. The intramedullary canal was accessed through a 3/8" drill hole. The intramedullary guide was inserted and position in order to obtain a neutral flexion gap. The intercondylar block was positioned with care taken to avoid notching the anterior cortex of the femur. The appropriate cut was made. Next, the distal cutting block was placed at 6 of valgus alignment. Using the 9 mm slot, the distal cut was made. The distal femur was measured and found to be optimally replicated by the 70 mm component. The 70 mm 4-in-1 cutting block was positioned and first the posterior, then the posterior chamfer, the anterior chamfer, femoral and intercondylar cuts were made. At this point, the posterior portions medial and lateral menisci were removed. A trial reduction was performed using the appropriate femoral and tibial components with  the 10 mm and 12 mm inserts. The 12 mm insert demonstrated excellent stability to varus and valgus stressing both in flexion and extension while permitting full extension. Patella tracking was assessed and found to be excellent. Therefore, the tibial guide position was marked on the proximal tibia. The patella thickness was measured and found to be 22 mm.  Therefore, the appropriate cut was made. The patellar surface was measured and found to be optimally replicated by the 34 mm component. The three peg holes were drilled in place before the trial button was inserted. Patella tracking was assessed and found to be excellent, passing the "no thumb test". The lug holes were drilled into the distal femur before the trial component was removed, leaving only the tibial tray. The keel was then created using the appropriate tower, reamer, and punch.  The bony surfaces were prepared for cementing by irrigating thoroughly with bacitracin saline solution. A bone plug was fashioned from some of the bone that had been removed previously and used to plug the distal femoral canal. In addition, 20 cc of Exparel diluted out to 60 cc with normal saline and 30 cc of 0.5% Sensorcaine were injected into the postero-medial and postero-lateral aspects of the knee, the medial and lateral gutter regions, and the peri-incisional tissues to help with postoperative analgesia. Meanwhile, the cement was being mixed on the back table. When it was ready, the tibial tray was cemented in first. The excess cement was removed using [employer] fast streamer. Next, the femoral component was impacted into place. Again, the excess cement was removed using [employer] fast streamer. The 12 mm trial insert was positioned and the knee brought into extension while the cement hardened. Finally, the patella was cemented into place and secured using the patellar clamp. Again, the excess cement was removed using [employer] fast streamer. Once the cement had hardened, the knee was placed through a range of motion with the findings as described above. Therefore, the trial insert was removed and, after verifying that no cement had been retained posteriorly, the permanent 12 mm anterior stabilized E-polyethylene insert was positioned and secured using the appropriate key locking mechanism. Again the knee was placed through a range of motion  with the findings as described above.  The wound was copiously irrigated with bacitracin saline solution using the jet lavage system before the quadriceps tendon and retinacular layer were reapproximated using #0 Vicryl interrupted sutures. The superficial retinacular layer also was closed using a running #0 Vicryl suture. A total of 10 cc of transexemic acid (TXA) was injected intra-articularly before the subcutaneous tissues were closed in several layers using 2-0 Vicryl interrupted sutures. The skin was closed using staples. A sterile honeycomb dressing was applied to the skin before the leg was wrapped with an Ace wrap to accommodate the Polar Care device. The patient was then awakened and returned to the recovery room in satisfactory condition after tolerating the procedure well.

## 2018-03-06 NOTE — Progress Notes (Signed)
Pt admitted to unit from PACU. Pt is A&Ox4, VSS other than HR in 50s (pt reports this is baseline for him). Lance PA notified of HR d/t pt having two different beta blockers scheduled- instructed to hold today. Foley and polar care in place, surgical dressing CDI to right knee. Pt tolerated CPM 70-100 for a couple of hours until spinal wore off. Pt oriented to room and plan of care.  [place], Jerry Caras

## 2018-03-06 NOTE — Progress Notes (Signed)
PT Cancellation Note  Patient Details Name: Stephen Erickson MRN: [id number] DOB: [birth year]   Cancelled Treatment:    Reason Eval/Treat Not Completed: Patient not medically ready.  PT consult received.  Chart reviewed.  Nurse reporting that pt's LE sensation was not back yet when she was in pt's room recently.  PT went to see pt to see if sensation was improved and pt still lacking appropriate sensation in LE's (pt stating that he wanted to walk but was not going to be able to walk yet since he couldn't feel his foot).  Will re-attempt PT evaluation tomorrow morning.  Leitha Bleak, PT 03/06/18, 4:02 PM [phone]

## 2018-03-06 NOTE — NC FL2 (Signed)
[place] LEVEL OF CARE SCREENING TOOL     IDENTIFICATION  Patient Name: Stephen Erickson Birthdate: [birth year] Sex: male Admission Date (Current Location): 03/06/2018  [place] and [place] Number:  Engineering geologist and Address:  [facility], [address] [place], [place] [address]      Provider Number: [id number]  Attending Physician Name and Address:  Corky Mull, MD  Relative Name and Phone Number:       Current Level of Care: Hospital Recommended Level of Care: [place] Prior Approval Number:    Date Approved/Denied:   PASRR Number: ([id number])  Discharge Plan: SNF    Current Diagnoses: Patient Active Problem List   Diagnosis Date Noted  . Status post total knee replacement using cement, right 03/06/2018  . Status post total knee replacement using cement, left 10/31/2017    Orientation RESPIRATION BLADDER Height & Weight     Self, Time, Situation, Place  Normal Continent Weight: 192 lb (87.1 kg) Height:  5\' 6"  (167.6 cm)  BEHAVIORAL SYMPTOMS/MOOD NEUROLOGICAL BOWEL NUTRITION STATUS      Continent Diet(Diet: Heart Healthy/ Carb Modified. )  AMBULATORY STATUS COMMUNICATION OF NEEDS Skin   Extensive Assist Verbally Surgical wounds(Incision: Right Knee. )                       Personal Care Assistance Level of Assistance  Bathing, Feeding, Dressing Bathing Assistance: Limited assistance Feeding assistance: Independent Dressing Assistance: Limited assistance     Functional Limitations Info  Sight, Hearing, Speech Sight Info: Adequate Hearing Info: Adequate Speech Info: Adequate    SPECIAL CARE FACTORS FREQUENCY  PT (By licensed PT), OT (By licensed OT)     PT Frequency: (5) OT Frequency: (5)            Contractures      Additional Factors Info  Code Status, Allergies Code Status Info: (Full Code. ) Allergies Info: (Lisinopril)           Current Medications  (03/06/2018):  This is the current hospital active medication list Current Facility-Administered Medications  Medication Dose Route Frequency Provider Last Rate Last Dose  . 0.9 %  sodium chloride infusion   Intravenous Continuous Poggi, Marshall Cork, MD 75 mL/hr at 03/06/18 1124    . acetaminophen (TYLENOL) tablet 1,000 mg  1,000 mg Oral Q6H Poggi, Marshall Cork, MD   1,000 mg at 03/06/18 1248  . [START ON 03/07/2018] acetaminophen (TYLENOL) tablet 325-650 mg  325-650 mg Oral Q6H PRN Poggi, Marshall Cork, MD      . Derrill Memo ON 03/07/2018] apixaban (ELIQUIS) tablet 5 mg  5 mg Oral BID Poggi, Marshall Cork, MD      . aspirin chewable tablet 81 mg  81 mg Oral Daily Poggi, Marshall Cork, MD      . atorvastatin (LIPITOR) tablet 80 mg  80 mg Oral QHS Poggi, Marshall Cork, MD      . bisacodyl (DULCOLAX) suppository 10 mg  10 mg Rectal Daily PRN Poggi, Marshall Cork, MD      . ceFAZolin (ANCEF) IVPB 2g/100 mL premix  2 g Intravenous Q6H Poggi, Marshall Cork, MD      . diltiazem (CARDIZEM LA) 24 hr tablet 120 mg  120 mg Oral Daily Poggi, Marshall Cork, MD      . diphenhydrAMINE (BENADRYL) 12.5 MG/5ML elixir 12.5-25 mg  12.5-25 mg Oral Q4H PRN Poggi, Marshall Cork, MD      . docusate sodium (COLACE)  capsule 100 mg  100 mg Oral BID Poggi, Marshall Cork, MD      . HYDROmorphone (DILAUDID) injection 0.5-1 mg  0.5-1 mg Intravenous Q4H PRN Poggi, Marshall Cork, MD      . ketorolac (TORADOL) 15 MG/ML injection 15 mg  15 mg Intravenous Q6H Poggi, Marshall Cork, MD   15 mg at 03/06/18 1249  . ketorolac (TORADOL) 30 MG/ML injection           . magnesium hydroxide (MILK OF MAGNESIA) suspension 30 mL  30 mL Oral Daily PRN Poggi, Marshall Cork, MD      . metoCLOPramide (REGLAN) tablet 5-10 mg  5-10 mg Oral Q8H PRN Poggi, Marshall Cork, MD       Or  . metoCLOPramide (REGLAN) injection 5-10 mg  5-10 mg Intravenous Q8H PRN Poggi, Marshall Cork, MD      . metoprolol succinate (TOPROL-XL) 24 hr tablet 25 mg  25 mg Oral Daily Poggi, Marshall Cork, MD      . ondansetron (ZOFRAN) tablet 4 mg  4 mg Oral Q6H PRN Poggi, Marshall Cork, MD       Or   . ondansetron (ZOFRAN) injection 4 mg  4 mg Intravenous Q6H PRN Poggi, Marshall Cork, MD      . oxyCODONE (Oxy IR/ROXICODONE) immediate release tablet 5-10 mg  5-10 mg Oral Q4H PRN Poggi, Marshall Cork, MD      . pantoprazole (PROTONIX) EC tablet 40 mg  40 mg Oral Daily Poggi, Marshall Cork, MD   40 mg at 03/06/18 1248  . sodium phosphate (FLEET) 7-19 GM/118ML enema 1 enema  1 enema Rectal Once PRN Poggi, Marshall Cork, MD      . sotalol (BETAPACE) tablet 80 mg  80 mg Oral BID Poggi, Marshall Cork, MD      . traMADol Veatrice Bourbon) tablet 50 mg  50 mg Oral Q6H PRN Poggi, Marshall Cork, MD         Discharge Medications: Please see discharge summary for a list of discharge medications.  Relevant Imaging Results:  Relevant Lab Results:   Additional Information (SSN: [id number])  Germain Koopmann, Veronia Beets, LCSW

## 2018-03-06 NOTE — Anesthesia Procedure Notes (Addendum)
Spinal  Patient location during procedure: OR Start time: 03/06/2018 7:36 AM End time: 03/06/2018 7:41 AM Staffing Anesthesiologist: Emmie Niemann, MD Resident/CRNA: Bernardo Heater, CRNA Performed: resident/CRNA  Preanesthetic Checklist Completed: patient identified, site marked, surgical consent, pre-op evaluation, timeout performed, IV checked, risks and benefits discussed and monitors and equipment checked Spinal Block Patient position: sitting Prep: ChloraPrep Patient monitoring: heart rate, continuous pulse ox and blood pressure Approach: left paramedian Location: L4-5 Injection technique: single-shot Needle Needle type: Introducer, Pencil-Tip and Pencan  Needle gauge: 24 G Needle length: 9 cm Additional Notes Negative paresthesia. Negative blood return. Positive free-flowing CSF. Expiration date of kit checked and confirmed. Patient tolerated procedure well, without complications.

## 2018-03-07 ENCOUNTER — Inpatient Hospital Stay: Payer: PPO

## 2018-03-07 LAB — CBC WITH DIFFERENTIAL/PLATELET
Abs Immature Granulocytes: 0.02 10*3/uL (ref 0.00–0.07)
BASOS PCT: 0 %
Basophils Absolute: 0 10*3/uL (ref 0.0–0.1)
EOS PCT: 2 %
Eosinophils Absolute: 0.2 10*3/uL (ref 0.0–0.5)
HCT: 38.1 % — ABNORMAL LOW (ref 39.0–52.0)
HEMOGLOBIN: 12.7 g/dL — AB (ref 13.0–17.0)
Immature Granulocytes: 0 %
LYMPHS PCT: 16 %
Lymphs Abs: 1.3 10*3/uL (ref 0.7–4.0)
MCH: 30 pg (ref 26.0–34.0)
MCHC: 33.3 g/dL (ref 30.0–36.0)
MCV: 89.9 fL (ref 80.0–100.0)
Monocytes Absolute: 0.6 10*3/uL (ref 0.1–1.0)
Monocytes Relative: 7 %
Neutro Abs: 6 10*3/uL (ref 1.7–7.7)
Neutrophils Relative %: 75 %
PLATELETS: 176 10*3/uL (ref 150–400)
RBC: 4.24 MIL/uL (ref 4.22–5.81)
RDW: 13.5 % (ref 11.5–15.5)
WBC: 8.2 10*3/uL (ref 4.0–10.5)
nRBC: 0 % (ref 0.0–0.2)

## 2018-03-07 LAB — BASIC METABOLIC PANEL
Anion gap: 5 (ref 5–15)
BUN: 13 mg/dL (ref 8–23)
CALCIUM: 8.2 mg/dL — AB (ref 8.9–10.3)
CO2: 26 mmol/L (ref 22–32)
CREATININE: 0.8 mg/dL (ref 0.61–1.24)
Chloride: 105 mmol/L (ref 98–111)
Glucose, Bld: 117 mg/dL — ABNORMAL HIGH (ref 70–99)
Potassium: 4 mmol/L (ref 3.5–5.1)
SODIUM: 136 mmol/L (ref 135–145)

## 2018-03-07 MED ORDER — INFLUENZA VAC SPLIT QUAD 0.5 ML IM SUSY
0.5000 mL | PREFILLED_SYRINGE | INTRAMUSCULAR | Status: AC
  Administered 2018-03-09: 0.5 mL via INTRAMUSCULAR
  Filled 2018-03-07: qty 0.5

## 2018-03-07 NOTE — Progress Notes (Signed)
Clinical Social Worker (CSW) received SNF consult. PT is recommending home health. RN case manager aware of above. Please reconsult if future social work needs arise. CSW signing off.   Talaysia Pinheiro, LCSW [phone] 

## 2018-03-07 NOTE — Progress Notes (Signed)
Pt refused to be dangled on side of bed

## 2018-03-07 NOTE — Anesthesia Postprocedure Evaluation (Signed)
Anesthesia Post Note  Patient: JVEON POUND  Procedure(s) Performed: TOTAL KNEE ARTHROPLASTY (Right )  Patient location during evaluation: Nursing Unit Anesthesia Type: Spinal Level of consciousness: awake and alert and oriented Pain management: satisfactory to patient Vital Signs Assessment: post-procedure vital signs reviewed and stable Respiratory status: respiratory function stable Cardiovascular status: stable Postop Assessment: adequate PO intake, no apparent nausea or vomiting, patient able to bend at knees, no backache, spinal receding and no headache Anesthetic complications: no     Last Vitals:  Vitals:   03/07/18 0017 03/07/18 0313  BP: (!) 123/92 134/78  Pulse: 75 67  Resp: 15 15  Temp: 36.8 C   SpO2: 97% 94%    Last Pain:  Vitals:   03/07/18 0017  TempSrc: Oral  PainSc:                  Blima Singer

## 2018-03-07 NOTE — Progress Notes (Signed)
  Subjective: 1 Day Post-Op Procedure(s) (LRB): TOTAL KNEE ARTHROPLASTY (Right) Patient reports pain as moderate.   Patient is well, and has had no acute complaints or problems PT and care management to assist with discharge planning. Negative for chest pain and shortness of breath Fever: no Gastrointestinal:Negative for nausea and vomiting  Objective: Vital signs in last 24 hours: Temp:  [97.6 F (36.4 C)-98.3 F (36.8 C)] 98.3 F (36.8 C) (10/16 0017) Pulse Rate:  [52-75] 67 (10/16 0313) Resp:  [14-23] 15 (10/16 0313) BP: (107-134)/(52-93) 134/78 (10/16 0313) SpO2:  [94 %-99 %] 94 % (10/16 0313)  Intake/Output from previous day:  Intake/Output Summary (Last 24 hours) at 03/07/2018 0802 Last data filed at 03/07/2018 0600 Gross per 24 hour  Intake 2695.69 ml  Output 1410 ml  Net 1285.69 ml    Intake/Output this shift: No intake/output data recorded.  Labs: Recent Labs    03/07/18 0449  HGB 12.7*   Recent Labs    03/07/18 0449  WBC 8.2  RBC 4.24  HCT 38.1*  PLT 176   Recent Labs    03/07/18 0449  NA 136  K 4.0  CL 105  CO2 26  BUN 13  CREATININE 0.80  GLUCOSE 117*  CALCIUM 8.2*   No results for input(s): LABPT, INR in the last 72 hours.   EXAM General - Patient is Alert, Appropriate and Oriented Extremity - ABD soft Neurovascular intact Sensation intact distally Intact pulses distally Dorsiflexion/Plantar flexion intact Incision: scant drainage No cellulitis present Dressing/Incision - blood tinged drainage Motor Function - intact, moving foot and toes well on exam. Normal bowel sounds, without distention.  Past Medical History:  Diagnosis Date  . Angina pectoris ([place])   . Arthritis   . Atrial fibrillation ([place])   . Coronary artery disease   . Dysrhythmia   . Hyperlipidemia   . Hypertension   . Myocardial infarction ([place]) 2011  . PAC (premature atrial contraction)   . Pneumonia   . Tachy-brady syndrome ([place])   . Vertebral artery  occlusion, left     Assessment/Plan: 1 Day Post-Op Procedure(s) (LRB): TOTAL KNEE ARTHROPLASTY (Right) Active Problems:   Status post total knee replacement using cement, right  Estimated body mass index is 30.99 kg/m as calculated from the following:   Height as of this encounter: 5\' 6"  (1.676 m).   Weight as of this encounter: 87.1 kg. Advance diet Up with therapy D/C IV fluids when tolerating po intake.  Labs reviewed. Up with therapy today. Begin working on BM, patient is passing gas without pain. CBC and BMP ordered for tomorrow morning. Plan for possible discharge home tomorrow.  DVT Prophylaxis - Lovenox, Foot Pumps and TED hose Weight-Bearing as tolerated to right leg  J. Cameron Proud, PA-C [facility] Orthopaedic Surgery 03/07/2018, 8:02 AM

## 2018-03-07 NOTE — Progress Notes (Signed)
Physical Therapy Treatment Patient Details Name: Stephen Erickson MRN: [id number] DOB: 09-09-1955 Today's Date: 03/07/2018    History of Present Illness Patient is 62 yo male s/p R TKA WBAT. PMH of HTN, CAD, previous MI, afib, L vertebral artery occulsion     PT Comments    Patient up in recliner at start of session, agreeable to PT. No pain in R knee at rest. Patient completed exercises with minimal verbal cues from PT and demonstrated good R knee ROM (-3deg - 92deg). Patient eager to ambulate, ambulated with RW, CGA ~65ft with improved gait quality compared to AM session (less antalgic on R, improved AD management). Patient complained of R calf pain, R calf swelling increased from AM, calf TTP, though redness/warmth not present. Nursing notified and in room at end of session to update patient on POC. The patient would benefit from further skilled PT to continue to progress towards goals and return to PLOF.      Follow Up Recommendations  Home health PT;Supervision for mobility/OOB     Equipment Recommendations  Rolling walker with 5" wheels    Recommendations for Other Services       Precautions / Restrictions Restrictions Weight Bearing Restrictions: Yes RLE Weight Bearing: Weight bearing as tolerated    Mobility  Bed Mobility Overal bed mobility: Modified Independent                Transfers Overall transfer level: Needs assistance Equipment used: Rolling walker (2 wheeled) Transfers: Sit to/from Stand Sit to Stand: Supervision            Ambulation/Gait Ambulation/Gait assistance: Min guard Gait Distance (Feet): 60 Feet Assistive device: Rolling walker (2 wheeled)       General Gait Details: Patient eager to continue ambulation. Improvement in gait quality from AM session. Patient complains of R calf pain. Further ambulation held due to calf swelling/tenderness.   Stairs             Wheelchair Mobility    Modified Rankin (Stroke  Patients Only)       Balance Overall balance assessment: Needs assistance Sitting-balance support: Feet supported;Feet unsupported Sitting balance-Leahy Scale: Good       Standing balance-Leahy Scale: Fair                              Cognition Arousal/Alertness: Awake/alert Behavior During Therapy: WFL for tasks assessed/performed Overall Cognitive Status: Within Functional Limits for tasks assessed                                        Exercises Total Joint Exercises Ankle Circles/Pumps: AROM;Both;20 reps Quad Sets: AROM;Both;20 reps Hip ABduction/ADduction: AROM;Right;20 reps;Strengthening Straight Leg Raises: AROM;Strengthening;Right;20 reps Knee Flexion: AROM;Right;10 reps Goniometric ROM: r knee extension: 3deg from neutral, 92deg flexion    General Comments        Pertinent Vitals/Pain Pain Assessment: Faces Faces Pain Scale: Hurts little more(with ambulation) Pain Location: R calf Pain Descriptors / Indicators: Cramping Pain Intervention(s): Repositioned;Limited activity within patient's tolerance;Monitored during session    Home Living                      Prior Function            PT Goals (current goals can now be found in the care plan section) Progress towards PT  goals: Progressing toward goals    Frequency    BID      PT Plan Current plan remains appropriate    Co-evaluation              AM-PAC PT "6 Clicks" Daily Activity  Outcome Measure  Difficulty turning over in bed (including adjusting bedclothes, sheets and blankets)?: None Difficulty moving from lying on back to sitting on the side of the bed? : A Little Difficulty sitting down on and standing up from a chair with arms (e.g., wheelchair, bedside commode, etc,.)?: Unable Help needed moving to and from a bed to chair (including a wheelchair)?: A Little Help needed walking in hospital room?: A Little Help needed climbing 3-5 steps with  a railing? : A Little 6 Click Score: 17    End of Session Equipment Utilized During Treatment: Gait belt Activity Tolerance: Patient tolerated treatment well;Patient limited by pain Patient left: in bed;with nursing/sitter in room;with call bell/phone within reach;with SCD's reapplied;with bed alarm set(polar care in place) Nurse Communication: Mobility status;Other (comment)(increased R calf swelling from AM session, ongoing calf tenderness, pain with DF) PT Visit Diagnosis: Other abnormalities of gait and mobility (R26.89);Muscle weakness (generalized) (M62.81);Difficulty in walking, not elsewhere classified (R26.2)     Time: 1350-1420 PT Time Calculation (min) (ACUTE ONLY): 30 min  Charges:  $Therapeutic Exercise: 8-22 mins $Therapeutic Activity: 8-22 mins                     Lieutenant Diego PT, DPT 2:41 PM,03/07/18 [phone]

## 2018-03-07 NOTE — Progress Notes (Signed)
Patient c/o pain to his right calf. Notified MD. New orders for [place] of RLE.

## 2018-03-07 NOTE — Evaluation (Addendum)
Physical Therapy Evaluation Patient Details Name: Stephen Erickson MRN: [id number] DOB: 12/26/1955 Today's Date: 03/07/2018   History of Present Illness  Patient is 62 yo male s/p R TKA WBAT. PMH of HTN, CAD, previous MI, afib, L vertebral artery occulsion   Clinical Impression  Patient A&Ox4 at start of session, mild complaints of R calf pain, nursing notified and formed PT surgeon has previously been paged an update. Patient reported living alone in 1 story home with 3 STE, independent prior to admission. Patient states he has family that lives nearby that can assist him at discharge as needed.  Patient performed therapeutic exercises with moderate verbal cues from PT but no physical assist. Patient demonstrated bed mobility mod I, sit <> stand transfers with CGA and RW, and ambulated ~188ft with CGA and mildly antalgic gait on R. 2-3 standing rest breaks needed due to pain. Overall the patient demonstrated limitations in mobility, gait, balance, strength, ROM, and endurance compared to PLOF and would benefit from further skilled PT to address these deficits and return patient to PLOF. Current recommendation is HHPT with intermittent supervision including mobility/OOB.    Follow Up Recommendations Home health PT;Supervision for mobility/OOB    Equipment Recommendations  Rolling walker with 5" wheels    Recommendations for Other Services       Precautions / Restrictions Restrictions Weight Bearing Restrictions: Yes RLE Weight Bearing: Weight bearing as tolerated      Mobility  Bed Mobility Overal bed mobility: Modified Independent                Transfers Overall transfer level: Needs assistance Equipment used: Rolling walker (2 wheeled) Transfers: Sit to/from Stand Sit to Stand: Min guard            Ambulation/Gait   Gait Distance (Feet): 180 Feet Assistive device: Rolling walker (2 wheeled) Gait Pattern/deviations: Antalgic     General Gait Details:  Patient able to progress to step to gait pattern, mild-mod antalgic gait on RLE. Verbal cues to improve posture  Stairs            Wheelchair Mobility    Modified Rankin (Stroke Patients Only)       Balance Overall balance assessment: Needs assistance Sitting-balance support: Feet supported;Feet unsupported Sitting balance-Leahy Scale: Good       Standing balance-Leahy Scale: Fair                               Pertinent Vitals/Pain Pain Assessment: 0-10 Pain Score: 2  Pain Location: R calf Pain Descriptors / Indicators: Cramping Pain Intervention(s): Repositioned    Home Living Family/patient expects to be discharged to:: Private residence Living Arrangements: Alone Available Help at Discharge: Family;Friend(s) Type of Home: House Home Access: Stairs to enter Entrance Stairs-Rails: Psychiatric nurse of Steps: 3 Home Layout: One level Home Equipment: Grab bars - tub/shower      Prior Function Level of Independence: Independent         Comments: Pt reports no falls in past 6 months.     Hand Dominance   Dominant Hand: Left    Extremity/Trunk Assessment   Upper Extremity Assessment Upper Extremity Assessment: Overall WFL for tasks assessed    Lower Extremity Assessment Lower Extremity Assessment: RLE deficits/detail;LLE deficits/detail RLE: Unable to fully assess due to pain RLE Sensation: WNL LLE Deficits / Details: WFLs, hx of knee replacement       Communication   Communication: No  difficulties  Cognition Arousal/Alertness: Awake/alert Behavior During Therapy: WFL for tasks assessed/performed Overall Cognitive Status: Within Functional Limits for tasks assessed                                        General Comments      Exercises Total Joint Exercises Ankle Circles/Pumps: AROM;Both;20 reps Quad Sets: AROM;Both;20 reps Short Arc Quad: AROM;Right;20 reps Straight Leg Raises:  AROM;Right;10 reps Other Exercises Other Exercises: Patient demonstrated LE dressing on EOB with sit to supine transfers with close supervision, no physical assist needed for dressing, verbal cues for technique    Assessment/Plan    PT Assessment Patient needs continued PT services  PT Problem List Decreased strength;Decreased range of motion;Decreased knowledge of use of DME;Decreased activity tolerance;Decreased balance;Decreased knowledge of precautions;Pain;Decreased mobility       PT Treatment Interventions DME instruction;Balance training;Gait training;Neuromuscular re-education;Stair training;Functional mobility training;Patient/family education;Therapeutic activities;Therapeutic exercise    PT Goals (Current goals can be found in the Care Plan section)  Acute Rehab PT Goals Patient Stated Goal: Patient eager to return to PLOF PT Goal Formulation: With patient Time For Goal Achievement: 03/21/18 Potential to Achieve Goals: Good    Frequency BID   Barriers to discharge        Co-evaluation               AM-PAC PT "6 Clicks" Daily Activity  Outcome Measure Difficulty turning over in bed (including adjusting bedclothes, sheets and blankets)?: None Difficulty moving from lying on back to sitting on the side of the bed? : A Little Difficulty sitting down on and standing up from a chair with arms (e.g., wheelchair, bedside commode, etc,.)?: Unable Help needed moving to and from a bed to chair (including a wheelchair)?: A Little Help needed walking in hospital room?: A Little Help needed climbing 3-5 steps with a railing? : A Little 6 Click Score: 17    End of Session Equipment Utilized During Treatment: Gait belt Activity Tolerance: Patient tolerated treatment well;Patient limited by pain Patient left: in chair;with chair alarm set;with call bell/phone within reach;with SCD's reapplied(heels elevated, polar care in place) Nurse Communication: Mobility status PT Visit  Diagnosis: Other abnormalities of gait and mobility (R26.89);Muscle weakness (generalized) (M62.81);Difficulty in walking, not elsewhere classified (R26.2)    Time: 6629-4765 PT Time Calculation (min) (ACUTE ONLY): 38 min   Charges:   PT Evaluation $PT Eval Low Complexity: 1 Low PT Treatments $Therapeutic Exercise: 8-22 mins $Therapeutic Activity: 8-22 mins       Lieutenant Diego PT, DPT 12:40 PM,03/07/18 [phone]

## 2018-03-08 LAB — CBC
HCT: 36 % — ABNORMAL LOW (ref 39.0–52.0)
Hemoglobin: 12.3 g/dL — ABNORMAL LOW (ref 13.0–17.0)
MCH: 30.3 pg (ref 26.0–34.0)
MCHC: 34.2 g/dL (ref 30.0–36.0)
MCV: 88.7 fL (ref 80.0–100.0)
NRBC: 0 % (ref 0.0–0.2)
PLATELETS: 182 10*3/uL (ref 150–400)
RBC: 4.06 MIL/uL — AB (ref 4.22–5.81)
RDW: 13.3 % (ref 11.5–15.5)
WBC: 8.5 10*3/uL (ref 4.0–10.5)

## 2018-03-08 LAB — BASIC METABOLIC PANEL
Anion gap: 6 (ref 5–15)
BUN: 10 mg/dL (ref 8–23)
CALCIUM: 8.3 mg/dL — AB (ref 8.9–10.3)
CO2: 26 mmol/L (ref 22–32)
Chloride: 100 mmol/L (ref 98–111)
Creatinine, Ser: 0.66 mg/dL (ref 0.61–1.24)
GFR calc Af Amer: >60 mL/min (ref >60)
GFR calc non Af Amer: >60 mL/min (ref >60)
Glucose, Bld: 109 mg/dL — ABNORMAL HIGH (ref 70–99)
Potassium: 4 mmol/L (ref 3.5–5.1)
Sodium: 132 mmol/L — ABNORMAL LOW (ref 135–145)

## 2018-03-08 MED ORDER — CYCLOBENZAPRINE HCL 10 MG PO TABS
5.0000 mg | ORAL_TABLET | Freq: Three times a day (TID) | ORAL | Status: DC | PRN
  Administered 2018-03-08 – 2018-03-09 (×3): 5 mg via ORAL
  Filled 2018-03-08 (×3): qty 1

## 2018-03-08 NOTE — Progress Notes (Signed)
Physical Therapy Treatment Patient Details Name: Stephen Erickson MRN: [id number] DOB: 04/22/1956 Today's Date: 03/08/2018    History of Present Illness Patient is 62 yo male s/p R TKA WBAT. PMH of HTN, CAD, previous MI, afib, L vertebral artery occulsion     PT Comments    Patient generally self-limiting participation with therapy session this PM, but agreeable to OOB/chair and short-distance gait with extensive cuing from therapist.  Able to complete most mobility with RW, cga/min assist; however, prefers to do things in "his way" and is generally disinterested in cuing/instruction from therapist at this time. Anticipate possible need for two sessions prior to planned discharge next date; still needs to complete full lap and stairs prior to discharge (3 with bilat rails, too far, to enter/exit home).    Follow Up Recommendations  Home health PT;Supervision for mobility/OOB     Equipment Recommendations  Rolling walker with 5" wheels    Recommendations for Other Services       Precautions / Restrictions Precautions Precautions: Fall Restrictions Weight Bearing Restrictions: Yes RLE Weight Bearing: Weight bearing as tolerated    Mobility  Bed Mobility Overal bed mobility: Modified Independent             General bed mobility comments: increased time/effort; utilizing bilat UEs to self-assist R LE over edge of bed  Transfers Overall transfer level: Needs assistance Equipment used: Rolling walker (2 wheeled) Transfers: Sit to/from Stand Sit to Stand: Min guard         General transfer comment: prefers to do it "his way" (one hand on cross bar of walker, other on lower side bar of walker) despite cuing from therapist.  Maintains R LE anterior to BOS, limited functional knee flexion; poor active use/WBing with movement transition  Ambulation/Gait Ambulation/Gait assistance: Min guard Gait Distance (Feet): 35 Feet Assistive device: Rolling walker (2 wheeled)        General Gait Details: 3-point, step to gait pattern; very slow and deliberate.  Maintains R knee in flexed position; constant cuing for R foot flat and R TKE in loading.     Stairs             Wheelchair Mobility    Modified Rankin (Stroke Patients Only)       Balance Overall balance assessment: Needs assistance Sitting-balance support: No upper extremity supported;Feet supported Sitting balance-Leahy Scale: Good     Standing balance support: Bilateral upper extremity supported Standing balance-Leahy Scale: Fair                              Cognition Arousal/Alertness: Awake/alert Behavior During Therapy: WFL for tasks assessed/performed Overall Cognitive Status: Within Functional Limits for tasks assessed                                        Exercises Total Joint Exercises Goniometric ROM: R knee: 10 -80 degrees, approximately Other Exercises Other Exercises: Supine LE therex, 1x12, act assist ROM: ankle pumps, quad sets, SAQs (emphasis on eccentric), heel slides with end-range flexion stretching, hip abduct/adduct and SLR.  Patient very guarded, limited by pain; difficulty with isolated muscle activation (self-terminates with pain) Other Exercises: Extensive education regarding R LE positioning-importance of towel roll, neutral rotation to maximize TKE.  Positioned appropriately in recliner end of session. Patient  voiced understanding; question carry-over. Other Exercises: Donned R  LE TEDs for edema control    General Comments        Pertinent Vitals/Pain Pain Assessment: Faces Faces Pain Scale: Hurts even more Pain Location: R knee/LE Pain Descriptors / Indicators: Aching;Grimacing;Guarding Pain Intervention(s): Limited activity within patient's tolerance;Monitored during session;Premedicated before session;Repositioned    Home Living                      Prior Function            PT Goals (current goals  can now be found in the care plan section) Acute Rehab PT Goals Patient Stated Goal: Patient eager to return to PLOF PT Goal Formulation: With patient Time For Goal Achievement: 03/21/18 Potential to Achieve Goals: Good Progress towards PT goals: Progressing toward goals    Frequency    BID      PT Plan Current plan remains appropriate    Co-evaluation              AM-PAC PT "6 Clicks" Daily Activity  Outcome Measure  Difficulty turning over in bed (including adjusting bedclothes, sheets and blankets)?: A Little Difficulty moving from lying on back to sitting on the side of the bed? : A Little Difficulty sitting down on and standing up from a chair with arms (e.g., wheelchair, bedside commode, etc,.)?: Unable Help needed moving to and from a bed to chair (including a wheelchair)?: A Little Help needed walking in hospital room?: A Little Help needed climbing 3-5 steps with a railing? : A Little 6 Click Score: 16    End of Session Equipment Utilized During Treatment: Gait belt Activity Tolerance: Patient limited by pain Patient left: in chair;with call bell/phone within reach;with chair alarm set Nurse Communication: Mobility status PT Visit Diagnosis: Other abnormalities of gait and mobility (R26.89);Muscle weakness (generalized) (M62.81);Difficulty in walking, not elsewhere classified (R26.2)     Time: 1357-1430 PT Time Calculation (min) (ACUTE ONLY): 33 min  Charges:  $Gait Training: 8-22 mins $Therapeutic Exercise: 8-22 mins                     Mylasia Vorhees H. Owens Shark, PT, DPT, NCS 03/08/18, 3:58 PM [phone]

## 2018-03-08 NOTE — Progress Notes (Signed)
  Subjective: 2 Days Post-Op Procedure(s) (LRB): TOTAL KNEE ARTHROPLASTY (Right) Patient reports pain as severe.  Reports increased knee pain this AM compared to yesterday. US of the RLE negative for DVT. Patient is well, and has had no acute complaints or problems PT and care management to assist with discharge planning. Negative for chest pain and shortness of breath Fever: no Gastrointestinal:Negative for nausea and vomiting  Objective: Vital signs in last 24 hours: Temp:  [98.5 F (36.9 C)-99.1 F (37.3 C)] 99.1 F (37.3 C) (10/17 0754) Pulse Rate:  [79-85] 84 (10/17 0754) Resp:  [18] 18 (10/17 0754) BP: (148-158)/(80-88) 158/80 (10/17 0754) SpO2:  [94 %-97 %] 97 % (10/17 0329)  Intake/Output from previous day:  Intake/Output Summary (Last 24 hours) at 03/08/2018 1323 Last data filed at 03/08/2018 0400 Gross per 24 hour  Intake 1271.21 ml  Output 1000 ml  Net 271.21 ml    Intake/Output this shift: No intake/output data recorded.  Labs: Recent Labs    03/07/18 0449 03/08/18 0650  HGB 12.7* 12.3*   Recent Labs    03/07/18 0449 03/08/18 0650  WBC 8.2 8.5  RBC 4.24 4.06*  HCT 38.1* 36.0*  PLT 176 182   Recent Labs    03/07/18 0449 03/08/18 0650  NA 136 132*  K 4.0 4.0  CL 105 100  CO2 26 26  BUN 13 10  CREATININE 0.80 0.66  GLUCOSE 117* 109*  CALCIUM 8.2* 8.3*   No results for input(s): LABPT, INR in the last 72 hours.   EXAM General - Patient is Alert, Appropriate and Oriented Extremity - ABD soft Neurovascular intact Sensation intact distally Intact pulses distally Dorsiflexion/Plantar flexion intact Incision: scant drainage No cellulitis present Dressing/Incision - blood tinged drainage Motor Function - intact, moving foot and toes well on exam. Normal bowel sounds, without distention.  Past Medical History:  Diagnosis Date  . Angina pectoris ([place])   . Arthritis   . Atrial fibrillation ([place])   . Coronary artery disease   .  Dysrhythmia   . Hyperlipidemia   . Hypertension   . Myocardial infarction ([place]) 2011  . PAC (premature atrial contraction)   . Pneumonia   . Tachy-brady syndrome ([place])   . Vertebral artery occlusion, left     Assessment/Plan: 2 Days Post-Op Procedure(s) (LRB): TOTAL KNEE ARTHROPLASTY (Right) Active Problems:   Status post total knee replacement using cement, right  Estimated body mass index is 30.99 kg/m as calculated from the following:   Height as of this encounter: 5\' 6"  (1.676 m).   Weight as of this encounter: 87.1 kg. Advance diet Up with therapy D/C IV fluids when tolerating po intake.  Labs reviewed. Up with therapy today. [place] Negative for DVT.  Added muscle relaxer, increased pain likely due to increased activity with PT and numbing medication beginning to wear off. Begin working on BM, patient is passing gas without pain. CBC and BMP ordered for tomorrow morning. Plan for possible discharge home tomorrow.  DVT Prophylaxis - Lovenox, Foot Pumps and TED hose Weight-Bearing as tolerated to right leg  J. Cameron Proud, PA-C [facility] 03/08/2018, 1:23 PM

## 2018-03-08 NOTE — Plan of Care (Signed)
  Problem: Education: Goal: Knowledge of General Education information will improve Description Including pain rating scale, medication(s)/side effects and non-pharmacologic comfort measures Outcome: Progressing   Problem: Clinical Measurements: Goal: Ability to maintain clinical measurements within normal limits will improve Outcome: Progressing Goal: Will remain free from infection Outcome: Progressing Goal: Diagnostic test results will improve Outcome: Progressing Goal: Respiratory complications will improve Outcome: Progressing Goal: Cardiovascular complication will be avoided Outcome: Progressing   Problem: Activity: Goal: Risk for activity intolerance will decrease Outcome: Progressing   Problem: Nutrition: Goal: Adequate nutrition will be maintained Outcome: Progressing   Problem: Coping: Goal: Level of anxiety will decrease Outcome: Progressing   Problem: Elimination: Goal: Will not experience complications related to bowel motility Outcome: Progressing Goal: Will not experience complications related to urinary retention Outcome: Progressing   Problem: Pain Managment: Goal: General experience of comfort will improve Outcome: Not Progressing   Problem: Safety: Goal: Ability to remain free from injury will improve Outcome: Progressing   Problem: Skin Integrity: Goal: Risk for impaired skin integrity will decrease Outcome: Progressing

## 2018-03-08 NOTE — Progress Notes (Signed)
Physical Therapy Treatment Patient Details Name: Stephen Erickson MRN: [id number] DOB: 1955-08-29 Today's Date: 03/08/2018    History of Present Illness Patient is 62 yo male s/p R TKA WBAT. PMH of HTN, CAD, previous MI, afib, L vertebral artery occulsion     PT Comments    Pt refused x 3 this am due to pain and edema.  Doppler completed yesterday - for DVT.  Pt has received pain medication this am.  Initially refused on 4th attempt this am but agreed to supine exercises as below.  Encouraged mobility but pt refused stating he would walk tomorrow.  Education provided but he continued to decline along with encouragement to get up to the chair for lunch.  He did state he would walk this afternoon.  Will attempt again this pm and encourage mobility.  Discharge has been held for today and anticipate discharge home tomorrow.   Follow Up Recommendations  Home health PT;Supervision for mobility/OOB     Equipment Recommendations       Recommendations for Other Services       Precautions / Restrictions Restrictions Weight Bearing Restrictions: Yes RLE Weight Bearing: Weight bearing as tolerated    Mobility  Bed Mobility                  Transfers                    Ambulation/Gait                 Stairs             Wheelchair Mobility    Modified Rankin (Stroke Patients Only)       Balance                                            Cognition Arousal/Alertness: Awake/alert Behavior During Therapy: WFL for tasks assessed/performed Overall Cognitive Status: Within Functional Limits for tasks assessed                                        Exercises Total Joint Exercises Ankle Circles/Pumps: AROM;Both;20 reps Quad Sets: AROM;Both;20 reps Heel Slides: AAROM;10 reps;Right;Supine Hip ABduction/ADduction: 10 reps;AAROM;Right;Supine Straight Leg Raises: 10 reps;AAROM;Right;Supine Other Exercises Other  Exercises: educationr egarding importance of mobility    General Comments        Pertinent Vitals/Pain Pain Assessment: 0-10 Pain Score: 7  Pain Location: R knee/LE Pain Descriptors / Indicators: Aching;Sore Pain Intervention(s): Limited activity within patient's tolerance;Premedicated before session;Monitored during session    Home Living                      Prior Function            PT Goals (current goals can now be found in the care plan section) Progress towards PT goals: Progressing toward goals    Frequency    BID      PT Plan Current plan remains appropriate    Co-evaluation              AM-PAC PT "6 Clicks" Daily Activity  Outcome Measure  Difficulty turning over in bed (including adjusting bedclothes, sheets and blankets)?: None Difficulty moving from lying on back to sitting on the side of the bed? :  A Little Difficulty sitting down on and standing up from a chair with arms (e.g., wheelchair, bedside commode, etc,.)?: Unable Help needed moving to and from a bed to chair (including a wheelchair)?: A Little Help needed walking in hospital room?: A Little Help needed climbing 3-5 steps with a railing? : A Little 6 Click Score: 17    End of Session   Activity Tolerance: Patient limited by pain Patient left: in bed;with call bell/phone within reach;with bed alarm set         Time: 1131-1140 PT Time Calculation (min) (ACUTE ONLY): 9 min  Charges:  $Therapeutic Exercise: 8-22 mins                     Chesley Noon, PTA 03/08/18, 11:43 AM

## 2018-03-08 NOTE — Progress Notes (Signed)
Patient refusing CPM, states he needs stronger pain medications.  Will advise MD

## 2018-03-08 NOTE — Care Management Note (Signed)
Case Management Note  Patient Details  Name: Stephen Erickson MRN: [id number] Date of Birth: 1955/06/30  Subjective/Objective:   POD # 2 right TKA. Met with patient at bedside to discuss discharge planning. Patient lives alone. He will be cared for by his nephew. He will need a walker. Ordered from [place] with [facility]. Offered a list of home care agencies. Patient wanted to use the agency he used for his last surgery which was [facility]. Referral to [facility] for HHPT. He is on Eliquis. It is anticipated that patient is discharging today after morning PT.                  Action/Plan: [facility] for HHPT. Walker from [place]. Eliquis.   Expected Discharge Date:                  Expected Discharge Plan:  [place]  In-House Referral:     Discharge planning Services  CM Consult  Post Acute Care Choice:  Durable Medical Equipment, Home Health Choice offered to:  Patient  DME Arranged:  Walker rolling DME Agency:  [place]:  PT [place]:  [facility]  Status of Service:  Completed, signed off  If discussed at [employer] of Stay Meetings, dates discussed:    Additional Comments:  [name], RN 03/08/2018, 9:07 AM

## 2018-03-09 LAB — BASIC METABOLIC PANEL
Anion gap: 10 (ref 5–15)
BUN: 13 mg/dL (ref 8–23)
CO2: 26 mmol/L (ref 22–32)
CREATININE: 0.84 mg/dL (ref 0.61–1.24)
Calcium: 8.7 mg/dL — ABNORMAL LOW (ref 8.9–10.3)
Chloride: 97 mmol/L — ABNORMAL LOW (ref 98–111)
GFR calc Af Amer: >60 mL/min (ref >60)
Glucose, Bld: 88 mg/dL (ref 70–99)
Potassium: 4.3 mmol/L (ref 3.5–5.1)
SODIUM: 133 mmol/L — AB (ref 135–145)

## 2018-03-09 LAB — CBC
HCT: 36.2 % — ABNORMAL LOW (ref 39.0–52.0)
Hemoglobin: 12.3 g/dL — ABNORMAL LOW (ref 13.0–17.0)
MCH: 30.2 pg (ref 26.0–34.0)
MCHC: 34 g/dL (ref 30.0–36.0)
MCV: 88.9 fL (ref 80.0–100.0)
Platelets: 204 10*3/uL (ref 150–400)
RBC: 4.07 MIL/uL — ABNORMAL LOW (ref 4.22–5.81)
RDW: 13.6 % (ref 11.5–15.5)
WBC: 8.2 10*3/uL (ref 4.0–10.5)
nRBC: 0 % (ref 0.0–0.2)

## 2018-03-09 MED ORDER — OXYCODONE HCL 5 MG PO TABS: 5.0000 mg | ORAL_TABLET | ORAL | 0 refills | Status: DC | PRN

## 2018-03-09 MED ORDER — TRAMADOL HCL 50 MG PO TABS: 50.0000 mg | ORAL_TABLET | Freq: Four times a day (QID) | ORAL | 0 refills | Status: AC | PRN

## 2018-03-09 MED ORDER — CYCLOBENZAPRINE HCL 5 MG PO TABS: 5.0000 mg | ORAL_TABLET | Freq: Three times a day (TID) | ORAL | 1 refills | Status: AC | PRN

## 2018-03-09 NOTE — Discharge Instructions (Signed)

## 2018-03-09 NOTE — Care Management Important Message (Signed)
Copy of signed IM left with patient in room.  

## 2018-03-09 NOTE — Discharge Summary (Signed)
Physician Discharge Summary  Patient ID: Stephen Erickson MRN: [id number] DOB/AGE: 1955-10-17 62 y.o.  Admit date: 03/06/2018 Discharge date: 03/09/2018  Admission Diagnoses:  PRIMARY OSTEOARTHRITIS OF RIGHT KNEE  Discharge Diagnoses: Patient Active Problem List   Diagnosis Date Noted  . Status post total knee replacement using cement, right 03/06/2018  . Status post total knee replacement using cement, left 10/31/2017    Past Medical History:  Diagnosis Date  . Angina pectoris ([place])   . Arthritis   . Atrial fibrillation ([place])   . Coronary artery disease   . Dysrhythmia   . Hyperlipidemia   . Hypertension   . Myocardial infarction ([employer]) 2011  . PAC (premature atrial contraction)   . Pneumonia   . Tachy-brady syndrome ([place])   . Vertebral artery occlusion, left      Transfusion: None.   Consultants (if any):   Discharged Condition: Improved  Hospital Course: Stephen Erickson is an 62 y.o. male who was admitted 03/06/2018 with a diagnosis of primary osteoarthritis of the right knee and went to the operating room on 03/06/2018 and underwent the above named procedures.    Surgeries: Procedure(s): TOTAL KNEE ARTHROPLASTY on 03/06/2018 Patient tolerated the surgery well. Taken to PACU where she was stabilized and then transferred to the orthopedic floor.  Continued on home dose of Eliquis 5mg  twice daily. Foot pumps applied bilaterally at 80 mm. Heels elevated on bed with rolled towels. No evidence of DVT. Negative [place]. Physical therapy started on day #1 for gait training and transfer. OT started day #1 for ADL and assisted devices.  Patient's IV and foley were removed on POD1.  Implants: Right TKA using all-cemented [employer] Vanguard system with a 70 mm PCR femur, a 71 mm tibial tray with a 12 mm AS E-poly insert, and a 34 x 8.5 mm all-poly 3-pegged domed patella.  He was given perioperative antibiotics:  Anti-infectives (From admission, onward)   Start      Dose/Rate Route Frequency Ordered Stop   03/06/18 1400  ceFAZolin (ANCEF) IVPB 2g/100 mL premix     2 g 200 mL/hr over 30 Minutes Intravenous Every 6 hours 03/06/18 1104 03/07/18 0207   03/06/18 0600  ceFAZolin (ANCEF) 2-4 GM/100ML-% IVPB    Note to Pharmacy:  Lorenza Cambridge   : cabinet override      03/06/18 0600 03/06/18 0749   03/05/18 2315  ceFAZolin (ANCEF) IVPB 2g/100 mL premix     2 g 200 mL/hr over 30 Minutes Intravenous  Once 03/05/18 2308 03/06/18 0804    .  He was given sequential compression devices, early ambulation, and Eliquis for DVT prophylaxis.  He benefited maximally from the hospital stay and there were no complications.    Recent vital signs:  Vitals:   03/08/18 1736 03/08/18 2357  BP: (!) 147/88 137/86  Pulse: 71 84  Resp:  18  Temp: 98.3 F (36.8 C) 98 F (36.7 C)  SpO2: 98% 92%    Recent laboratory studies:  Lab Results  Component Value Date   HGB 12.3 (L) 03/09/2018   HGB 12.3 (L) 03/08/2018   HGB 12.7 (L) 03/07/2018   Lab Results  Component Value Date   WBC 8.2 03/09/2018   PLT 204 03/09/2018   Lab Results  Component Value Date   INR 1.17 02/20/2018   Lab Results  Component Value Date   NA 133 (L) 03/09/2018   K 4.3 03/09/2018   CL 97 (L) 03/09/2018   CO2 26 03/09/2018  BUN 13 03/09/2018   CREATININE 0.84 03/09/2018   GLUCOSE 88 03/09/2018    Discharge Medications:   Allergies as of 03/09/2018      Reactions   Lisinopril Cough      Medication List    TAKE these medications   aspirin 81 MG chewable tablet Chew 81 mg by mouth daily.   atorvastatin 80 MG tablet Commonly known as:  LIPITOR Take 80 mg by mouth at bedtime.   cyclobenzaprine 5 MG tablet Commonly known as:  FLEXERIL Take 1 tablet (5 mg total) by mouth 3 (three) times daily as needed for muscle spasms.   diltiazem 120 MG 24 hr tablet Commonly known as:  CARDIZEM LA Take 120 mg by mouth daily.   ELIQUIS 5 MG Tabs tablet Generic drug:  apixaban Take 5  mg by mouth 2 (two) times daily.   metoprolol succinate 25 MG 24 hr tablet Commonly known as:  TOPROL-XL Take 25 mg by mouth daily.   oxyCODONE 5 MG immediate release tablet Commonly known as:  Oxy IR/ROXICODONE Take 1-2 tablets (5-10 mg total) by mouth every 4 (four) hours as needed for moderate pain.   sotalol 80 MG tablet Commonly known as:  BETAPACE Take 80 mg by mouth 2 (two) times daily.   traMADol 50 MG tablet Commonly known as:  ULTRAM Take 1 tablet (50 mg total) by mouth every 6 (six) hours as needed for moderate pain.            Durable Medical Equipment  (From admission, onward)         Start     Ordered   03/06/18 1105  DME Bedside commode  Once    Question:  Patient needs a bedside commode to treat with the following condition  Answer:  Status post total knee replacement using cement, right   03/06/18 1104   03/06/18 1105  DME 3 n 1  Once     03/06/18 1104   03/06/18 1105  DME Walker rolling  Once    Question:  Patient needs a walker to treat with the following condition  Answer:  Status post total knee replacement using cement, right   03/06/18 1104          Diagnostic Studies: Dg Chest 2 View  Result Date: 02/20/2018 CLINICAL DATA:  Preoperative for knee surgery EXAM: CHEST - 2 VIEW COMPARISON:  Oct 18, 2017 FINDINGS: The heart size and mediastinal contours are within normal limits. Both lungs are clear. Degenerative joint changes of the spine with scoliosis of spine are noted. IMPRESSION: No active cardiopulmonary disease. Electronically Signed   By: Abelardo Diesel M.D.   On: 02/20/2018 13:21   US Venous Img Lower Unilateral Right  Result Date: 03/07/2018 CLINICAL DATA:  Right calf swelling. EXAM: RIGHT LOWER EXTREMITY VENOUS DUPLEX ULTRASOUND TECHNIQUE: Doppler venous assessment of the right lower extremity deep venous system was performed, including characterization of spectral flow, compressibility, and plasticity. COMPARISON:  None. FINDINGS: There  is complete compressibility of the right common femoral, femoral, and popliteal veins. Doppler analysis demonstrates respiratory phasicity and augmentation of flow with calf compression. No obvious superficial vein or calf vein thrombosis. IMPRESSION: No evidence of right lower extremity DVT. Electronically Signed   By: Marybelle Killings M.D.   On: 03/07/2018 20:38   Dg Knee Right Port  Result Date: 03/06/2018 CLINICAL DATA:  Status post knee replacement EXAM: PORTABLE RIGHT KNEE - 1-2 VIEW COMPARISON:  None. FINDINGS: The patient is status post knee replacement.  Postoperative air seen in the soft tissues. Skin staples. Hardware is in good position. IMPRESSION: Status post right knee replacement. Electronically Signed   By: Dorise Bullion III M.D   On: 03/06/2018 10:56   Disposition: Plan will be for discharge home today following morning session of PT.  Follow-up Information    Lattie Corns, PA-C Follow up in 14 day(s).   Specialty:  Physician Assistant Why:  Electa Sniff information: [place] [address] [phone]          Signed: Judson Roch PA-C 03/09/2018, 8:07 AM

## 2018-03-09 NOTE — Progress Notes (Signed)
Physical Therapy Treatment Patient Details Name: Stephen Erickson MRN: [id number] DOB: [birth year] Today's Date: 03/09/2018    History of Present Illness Patient is 62 yo male s/p R TKA WBAT. PMH of HTN, CAD, previous MI, afib, L vertebral artery occulsion     PT Comments    Patient demonstrating ability to complete basic transfers, gait (150'x 2) with RW and stairs (up/down 4 with bilat UEs on rail) with cga/close sup.  Consistent cuing for active use/WBing, mechanics of R LE with all mobility tasks; patient voices understanding, but insists that he will be unable to correct until the "swelling is gone in three days". Comfortable with current performance and functional status; no further questions about pending discharge home.  Safe to negotiate home environment with continued use of RW and HHPT follow up.   Follow Up Recommendations  Home health PT;Supervision for mobility/OOB     Equipment Recommendations  Rolling walker with 5" wheels    Recommendations for Other Services       Precautions / Restrictions Precautions Precautions: Fall Restrictions Weight Bearing Restrictions: Yes RLE Weight Bearing: Weight bearing as tolerated    Mobility  Bed Mobility Overal bed mobility: Modified Independent             General bed mobility comments: utilizing hook technique to self-assist R LE over edge of bed  Transfers Overall transfer level: Needs assistance Equipment used: Rolling walker (2 wheeled) Transfers: Sit to/from Stand Sit to Stand: Min guard;Supervision         General transfer comment: assist to stabilize RW, as patient continues to pull on RW in "his way" despite cuing  Ambulation/Gait Ambulation/Gait assistance: Min guard;Supervision Gait Distance (Feet): 150 Feet(x2) Assistive device: Rolling walker (2 wheeled)   Gait velocity: 10' walk time, 9-10 seconds   General Gait Details: progressing towards step through gait pattern with improved R foot  flat in loading phase; decreased R TKE, maintains in flexed, guarded position.  Constant cuing for posutral extension and upward gaze   Stairs Stairs: Yes Stairs assistance: Min guard Stair Management: One rail Right(bilat UEs on single rail) Number of Stairs: 4 General stair comments: min cuing for technique and to ensure R TKE in modified SLS   Wheelchair Mobility    Modified Rankin (Stroke Patients Only)       Balance Overall balance assessment: Needs assistance Sitting-balance support: No upper extremity supported;Feet supported Sitting balance-Leahy Scale: Good     Standing balance support: Bilateral upper extremity supported Standing balance-Leahy Scale: Fair                              Cognition Arousal/Alertness: Awake/alert Behavior During Therapy: WFL for tasks assessed/performed Overall Cognitive Status: Within Functional Limits for tasks assessed                                        Exercises Total Joint Exercises Goniometric ROM: R knee: 10-70 degrees, limited by pain Other Exercises Other Exercises: Issued handout with written/pictorial descriptions of LE therex for use as HEP at discharge; patient voiced understanding. Other Exercises: Verbally reviewed recommendations for car transfer technique; reinforced importance of HEP, intermittent mobility (1x/hour) and positioning upon discharge; patient voiced understanding.    General Comments        Pertinent Vitals/Pain Pain Assessment: Faces Faces Pain Scale: Hurts little more Pain Location:  R knee/LE Pain Descriptors / Indicators: Aching;Grimacing;Guarding Pain Intervention(s): Limited activity within patient's tolerance;Repositioned;Monitored during session;Premedicated before session    Home Living                      Prior Function            PT Goals (current goals can now be found in the care plan section) Acute Rehab PT Goals Patient Stated Goal:  Patient eager to return to PLOF PT Goal Formulation: With patient Time For Goal Achievement: 03/21/18 Potential to Achieve Goals: Good Progress towards PT goals: Progressing toward goals    Frequency    BID      PT Plan Current plan remains appropriate    Co-evaluation              AM-PAC PT "6 Clicks" Daily Activity  Outcome Measure  Difficulty turning over in bed (including adjusting bedclothes, sheets and blankets)?: A Little Difficulty moving from lying on back to sitting on the side of the bed? : A Little Difficulty sitting down on and standing up from a chair with arms (e.g., wheelchair, bedside commode, etc,.)?: Unable Help needed moving to and from a bed to chair (including a wheelchair)?: A Little Help needed walking in hospital room?: A Little Help needed climbing 3-5 steps with a railing? : A Little 6 Click Score: 16    End of Session Equipment Utilized During Treatment: Gait belt Activity Tolerance: Patient tolerated treatment well Patient left: in bed;with bed alarm set;with family/visitor present;with call bell/phone within reach Nurse Communication: Mobility status PT Visit Diagnosis: Other abnormalities of gait and mobility (R26.89);Muscle weakness (generalized) (M62.81);Difficulty in walking, not elsewhere classified (R26.2)     Time: 9407-6808 PT Time Calculation (min) (ACUTE ONLY): 23 min  Charges:  $Gait Training: 8-22 mins $Therapeutic Activity: 8-22 mins                    Elienai Gailey H. Owens Shark, PT, DPT, NCS 03/09/18, 3:34 PM [phone]

## 2018-03-09 NOTE — Progress Notes (Signed)
AVS and hard scripts given to patient and son. All discharge paperwork gone over at this time. Dressing to R knee changed. PIC removed. All belongings packed and patient was escorted to car via wc with nurse.   Bethann Punches, RN

## 2018-03-09 NOTE — Progress Notes (Addendum)
  Subjective: 3 Days Post-Op Procedure(s) (LRB): TOTAL KNEE ARTHROPLASTY (Right) Patient reports pain as moderate.  Improved when compared to yesterday. US of the RLE negative for DVT. Patient is well, and has had no acute complaints or problems PT and care management to assist with discharge planning.  Plan is for discharge home with HHPT. Negative for chest pain and shortness of breath Fever: no Gastrointestinal:Negative for nausea and vomiting  Objective: Vital signs in last 24 hours: Temp:  [98 F (36.7 C)-98.3 F (36.8 C)] 98 F (36.7 C) (10/17 2357) Pulse Rate:  [71-84] 84 (10/17 2357) Resp:  [18] 18 (10/17 2357) BP: (137-147)/(86-88) 137/86 (10/17 2357) SpO2:  [92 %-98 %] 92 % (10/17 2357)  Intake/Output from previous day:  Intake/Output Summary (Last 24 hours) at 03/09/2018 0802 Last data filed at 03/09/2018 0500 Gross per 24 hour  Intake 500 ml  Output 500 ml  Net 0 ml    Intake/Output this shift: No intake/output data recorded.  Labs: Recent Labs    03/07/18 0449 03/08/18 0650 03/09/18 0449  HGB 12.7* 12.3* 12.3*   Recent Labs    03/08/18 0650 03/09/18 0449  WBC 8.5 8.2  RBC 4.06* 4.07*  HCT 36.0* 36.2*  PLT 182 204   Recent Labs    03/08/18 0650 03/09/18 0449  NA 132* 133*  K 4.0 4.3  CL 100 97*  CO2 26 26  BUN 10 13  CREATININE 0.66 0.84  GLUCOSE 109* 88  CALCIUM 8.3* 8.7*   No results for input(s): LABPT, INR in the last 72 hours.   EXAM General - Patient is Alert, Appropriate and Oriented Extremity - ABD soft Neurovascular intact Sensation intact distally Intact pulses distally Dorsiflexion/Plantar flexion intact Incision: scant drainage No cellulitis present  Right calf soft to palpation. Dressing/Incision - blood tinged drainage Motor Function - intact, moving foot and toes well on exam. Normal bowel sounds, without distention.  Past Medical History:  Diagnosis Date  . Angina pectoris ([place])   . Arthritis   . Atrial  fibrillation ([place])   . Coronary artery disease   . Dysrhythmia   . Hyperlipidemia   . Hypertension   . Myocardial infarction ([place]) 2011  . PAC (premature atrial contraction)   . Pneumonia   . Tachy-brady syndrome ([place])   . Vertebral artery occlusion, left     Assessment/Plan: 3 Days Post-Op Procedure(s) (LRB): TOTAL KNEE ARTHROPLASTY (Right) Active Problems:   Status post total knee replacement using cement, right  Estimated body mass index is 30.99 kg/m as calculated from the following:   Height as of this encounter: 5\' 6"  (1.676 m).   Weight as of this encounter: 87.1 kg. Advance diet Up with therapy D/C IV fluids when tolerating po intake.  Labs reviewed.  WBC 8.2 Up with therapy today this morning. Pain improved this morning compared to yesterday. Patient is passing gas without pain and denies any abd pain or N/V Plan will be for discharge home today.  DVT Prophylaxis - Foot Pumps, TED hose and Eliquis Weight-Bearing as tolerated to right leg  J. Cameron Proud, PA-C [facility] Orthopaedic Surgery 03/09/2018, 8:02 AM

## 2018-03-10 DIAGNOSIS — I6501 Occlusion and stenosis of right vertebral artery: Secondary | ICD-10-CM | POA: Diagnosis not present

## 2018-03-10 DIAGNOSIS — I491 Atrial premature depolarization: Secondary | ICD-10-CM | POA: Diagnosis not present

## 2018-03-10 DIAGNOSIS — Z9181 History of falling: Secondary | ICD-10-CM | POA: Diagnosis not present

## 2018-03-10 DIAGNOSIS — I495 Sick sinus syndrome: Secondary | ICD-10-CM | POA: Diagnosis not present

## 2018-03-10 DIAGNOSIS — Z7982 Long term (current) use of aspirin: Secondary | ICD-10-CM | POA: Diagnosis not present

## 2018-03-10 DIAGNOSIS — Z7901 Long term (current) use of anticoagulants: Secondary | ICD-10-CM | POA: Diagnosis not present

## 2018-03-10 DIAGNOSIS — Z96653 Presence of artificial knee joint, bilateral: Secondary | ICD-10-CM | POA: Diagnosis not present

## 2018-03-10 DIAGNOSIS — I4891 Unspecified atrial fibrillation: Secondary | ICD-10-CM | POA: Diagnosis not present

## 2018-03-10 DIAGNOSIS — E785 Hyperlipidemia, unspecified: Secondary | ICD-10-CM | POA: Diagnosis not present

## 2018-03-10 DIAGNOSIS — Z8701 Personal history of pneumonia (recurrent): Secondary | ICD-10-CM | POA: Diagnosis not present

## 2018-03-10 DIAGNOSIS — Z471 Aftercare following joint replacement surgery: Secondary | ICD-10-CM | POA: Diagnosis not present

## 2018-03-10 DIAGNOSIS — I252 Old myocardial infarction: Secondary | ICD-10-CM | POA: Diagnosis not present

## 2018-03-10 DIAGNOSIS — I2510 Atherosclerotic heart disease of native coronary artery without angina pectoris: Secondary | ICD-10-CM | POA: Diagnosis not present

## 2018-03-10 DIAGNOSIS — I10 Essential (primary) hypertension: Secondary | ICD-10-CM | POA: Diagnosis not present

## 2018-03-10 DIAGNOSIS — Z87891 Personal history of nicotine dependence: Secondary | ICD-10-CM | POA: Diagnosis not present

## 2018-03-10 DIAGNOSIS — Z79891 Long term (current) use of opiate analgesic: Secondary | ICD-10-CM | POA: Diagnosis not present

## 2018-03-15 ENCOUNTER — Other Ambulatory Visit: Payer: Self-pay

## 2018-03-15 NOTE — Patient Outreach (Signed)
[place] [place] [facility]) Care Management  03/15/2018  Stephen Erickson [birth year] [id number]     EMMI-GENERAL RED ON EMMI ALERT Day # 4 Date: 03/16/18 Red Alert Reason: " Sad/hopeless/anxious/empty? Yes"   Outreach attempt # 1 to patient. Spoke with patient who reports that he is doing well. He denies any new issues or concerns since he has been discharged home. Reviewed and addressed red alert with patient. He does not recall answering that way. He denies any of those symptoms. Patient confirmed that HHPT has been coming out to his home to work with him and he is making some progress. He goes for his MD follow up appt on 03/20/18. Patient denies any issues or concerns regarding transportation and/or medications. Patient has completed automated post discharge EMMI-General calls. Patient verbalizes no further RN CM needs or concerns at this time.        Plan: RN CM will close case at this time as no further interventions warranted.   Enzo Montgomery, RN,BSN,CCM [place] Management Telephonic Care Management Coordinator Direct Phone: [phone] Toll Free: [phone] Fax: [phone]

## 2018-03-20 DIAGNOSIS — M6281 Muscle weakness (generalized): Secondary | ICD-10-CM | POA: Diagnosis not present

## 2018-03-20 DIAGNOSIS — Z471 Aftercare following joint replacement surgery: Secondary | ICD-10-CM | POA: Diagnosis not present

## 2018-03-20 DIAGNOSIS — Z96651 Presence of right artificial knee joint: Secondary | ICD-10-CM | POA: Diagnosis not present

## 2018-03-20 DIAGNOSIS — M25661 Stiffness of right knee, not elsewhere classified: Secondary | ICD-10-CM | POA: Diagnosis not present

## 2018-03-20 DIAGNOSIS — M25561 Pain in right knee: Secondary | ICD-10-CM | POA: Diagnosis not present

## 2018-03-23 DIAGNOSIS — M25561 Pain in right knee: Secondary | ICD-10-CM | POA: Diagnosis not present

## 2018-03-23 DIAGNOSIS — M25661 Stiffness of right knee, not elsewhere classified: Secondary | ICD-10-CM | POA: Diagnosis not present

## 2018-03-23 DIAGNOSIS — M6281 Muscle weakness (generalized): Secondary | ICD-10-CM | POA: Diagnosis not present

## 2018-03-23 DIAGNOSIS — Z96651 Presence of right artificial knee joint: Secondary | ICD-10-CM | POA: Diagnosis not present

## 2018-03-26 DIAGNOSIS — M25561 Pain in right knee: Secondary | ICD-10-CM | POA: Diagnosis not present

## 2018-03-26 DIAGNOSIS — M25661 Stiffness of right knee, not elsewhere classified: Secondary | ICD-10-CM | POA: Diagnosis not present

## 2018-03-26 DIAGNOSIS — Z96651 Presence of right artificial knee joint: Secondary | ICD-10-CM | POA: Diagnosis not present

## 2018-03-26 DIAGNOSIS — M6281 Muscle weakness (generalized): Secondary | ICD-10-CM | POA: Diagnosis not present

## 2018-04-03 DIAGNOSIS — Z96651 Presence of right artificial knee joint: Secondary | ICD-10-CM | POA: Diagnosis not present

## 2018-04-03 DIAGNOSIS — M6281 Muscle weakness (generalized): Secondary | ICD-10-CM | POA: Diagnosis not present

## 2018-04-03 DIAGNOSIS — M25561 Pain in right knee: Secondary | ICD-10-CM | POA: Diagnosis not present

## 2018-04-03 DIAGNOSIS — M25661 Stiffness of right knee, not elsewhere classified: Secondary | ICD-10-CM | POA: Diagnosis not present

## 2018-04-10 DIAGNOSIS — M6281 Muscle weakness (generalized): Secondary | ICD-10-CM | POA: Diagnosis not present

## 2018-04-10 DIAGNOSIS — M25561 Pain in right knee: Secondary | ICD-10-CM | POA: Diagnosis not present

## 2018-04-10 DIAGNOSIS — M25661 Stiffness of right knee, not elsewhere classified: Secondary | ICD-10-CM | POA: Diagnosis not present

## 2018-04-10 DIAGNOSIS — Z96651 Presence of right artificial knee joint: Secondary | ICD-10-CM | POA: Diagnosis not present

## 2018-04-18 DIAGNOSIS — Z96651 Presence of right artificial knee joint: Secondary | ICD-10-CM | POA: Diagnosis not present

## 2018-04-18 DIAGNOSIS — M6281 Muscle weakness (generalized): Secondary | ICD-10-CM | POA: Diagnosis not present

## 2018-04-18 DIAGNOSIS — M25661 Stiffness of right knee, not elsewhere classified: Secondary | ICD-10-CM | POA: Diagnosis not present

## 2018-04-18 DIAGNOSIS — M1731 Unilateral post-traumatic osteoarthritis, right knee: Secondary | ICD-10-CM | POA: Diagnosis not present

## 2018-04-18 DIAGNOSIS — M25561 Pain in right knee: Secondary | ICD-10-CM | POA: Diagnosis not present

## 2018-04-23 DIAGNOSIS — Z96651 Presence of right artificial knee joint: Secondary | ICD-10-CM | POA: Diagnosis not present

## 2018-04-23 DIAGNOSIS — M25661 Stiffness of right knee, not elsewhere classified: Secondary | ICD-10-CM | POA: Diagnosis not present

## 2018-04-23 DIAGNOSIS — M6281 Muscle weakness (generalized): Secondary | ICD-10-CM | POA: Diagnosis not present

## 2018-04-23 DIAGNOSIS — M25561 Pain in right knee: Secondary | ICD-10-CM | POA: Diagnosis not present

## 2018-05-04 DIAGNOSIS — Z96651 Presence of right artificial knee joint: Secondary | ICD-10-CM | POA: Diagnosis not present

## 2018-05-04 DIAGNOSIS — M1731 Unilateral post-traumatic osteoarthritis, right knee: Secondary | ICD-10-CM | POA: Diagnosis not present

## 2018-06-08 DIAGNOSIS — Z471 Aftercare following joint replacement surgery: Secondary | ICD-10-CM | POA: Diagnosis not present

## 2018-06-27 DIAGNOSIS — Z96651 Presence of right artificial knee joint: Secondary | ICD-10-CM | POA: Diagnosis not present

## 2018-07-03 DIAGNOSIS — Z471 Aftercare following joint replacement surgery: Secondary | ICD-10-CM | POA: Diagnosis not present

## 2018-07-12 DIAGNOSIS — Z79899 Other long term (current) drug therapy: Secondary | ICD-10-CM | POA: Diagnosis not present

## 2018-07-12 DIAGNOSIS — I471 Supraventricular tachycardia: Secondary | ICD-10-CM | POA: Diagnosis not present

## 2018-07-12 DIAGNOSIS — I495 Sick sinus syndrome: Secondary | ICD-10-CM | POA: Diagnosis not present

## 2018-07-12 DIAGNOSIS — Z5181 Encounter for therapeutic drug level monitoring: Secondary | ICD-10-CM | POA: Diagnosis not present

## 2018-07-12 DIAGNOSIS — I480 Paroxysmal atrial fibrillation: Secondary | ICD-10-CM | POA: Diagnosis not present

## 2018-07-12 DIAGNOSIS — R55 Syncope and collapse: Secondary | ICD-10-CM | POA: Diagnosis not present

## 2018-07-12 DIAGNOSIS — Z7901 Long term (current) use of anticoagulants: Secondary | ICD-10-CM | POA: Diagnosis not present

## 2018-08-29 DIAGNOSIS — E782 Mixed hyperlipidemia: Secondary | ICD-10-CM | POA: Diagnosis not present

## 2018-08-29 DIAGNOSIS — Z01818 Encounter for other preprocedural examination: Secondary | ICD-10-CM | POA: Diagnosis not present

## 2018-08-29 DIAGNOSIS — I495 Sick sinus syndrome: Secondary | ICD-10-CM | POA: Diagnosis not present

## 2018-08-29 DIAGNOSIS — I2510 Atherosclerotic heart disease of native coronary artery without angina pectoris: Secondary | ICD-10-CM | POA: Diagnosis not present

## 2018-08-29 DIAGNOSIS — Z0000 Encounter for general adult medical examination without abnormal findings: Secondary | ICD-10-CM | POA: Diagnosis not present

## 2018-08-29 DIAGNOSIS — Z79899 Other long term (current) drug therapy: Secondary | ICD-10-CM | POA: Diagnosis not present

## 2018-09-10 DIAGNOSIS — I2510 Atherosclerotic heart disease of native coronary artery without angina pectoris: Secondary | ICD-10-CM | POA: Diagnosis not present

## 2018-09-22 IMAGING — CR DG CHEST 2V
1 series · 2 of 2 positions shown · non-contrast
Comparison: 08/25/2008

CLINICAL DATA: Preop knee surgery

EXAM:
CHEST - 2 VIEW

[Series 1: dg chest 2 view · 0.14mm/px · 2 of 2 slices shown]
[im 1/2]
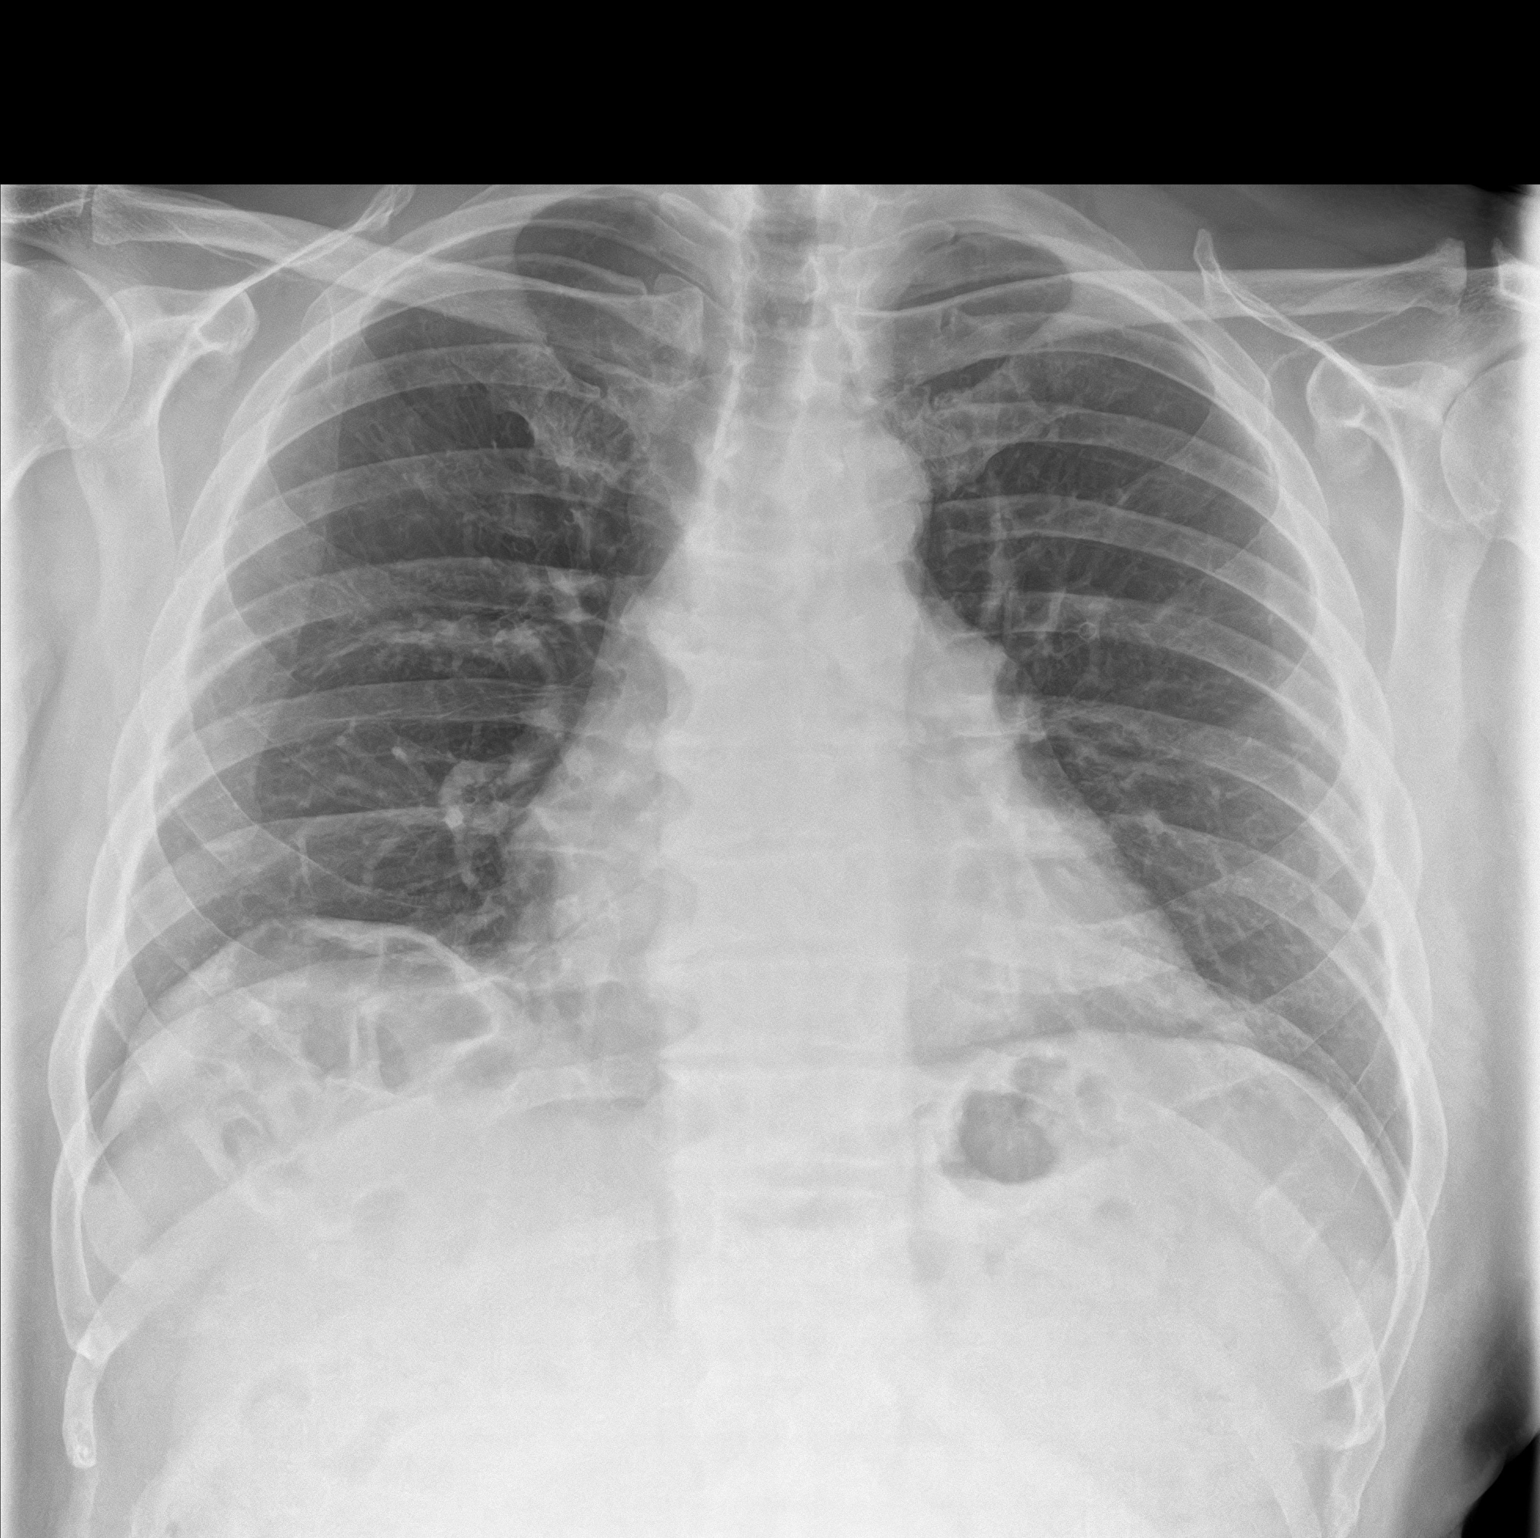
[im 2/2]
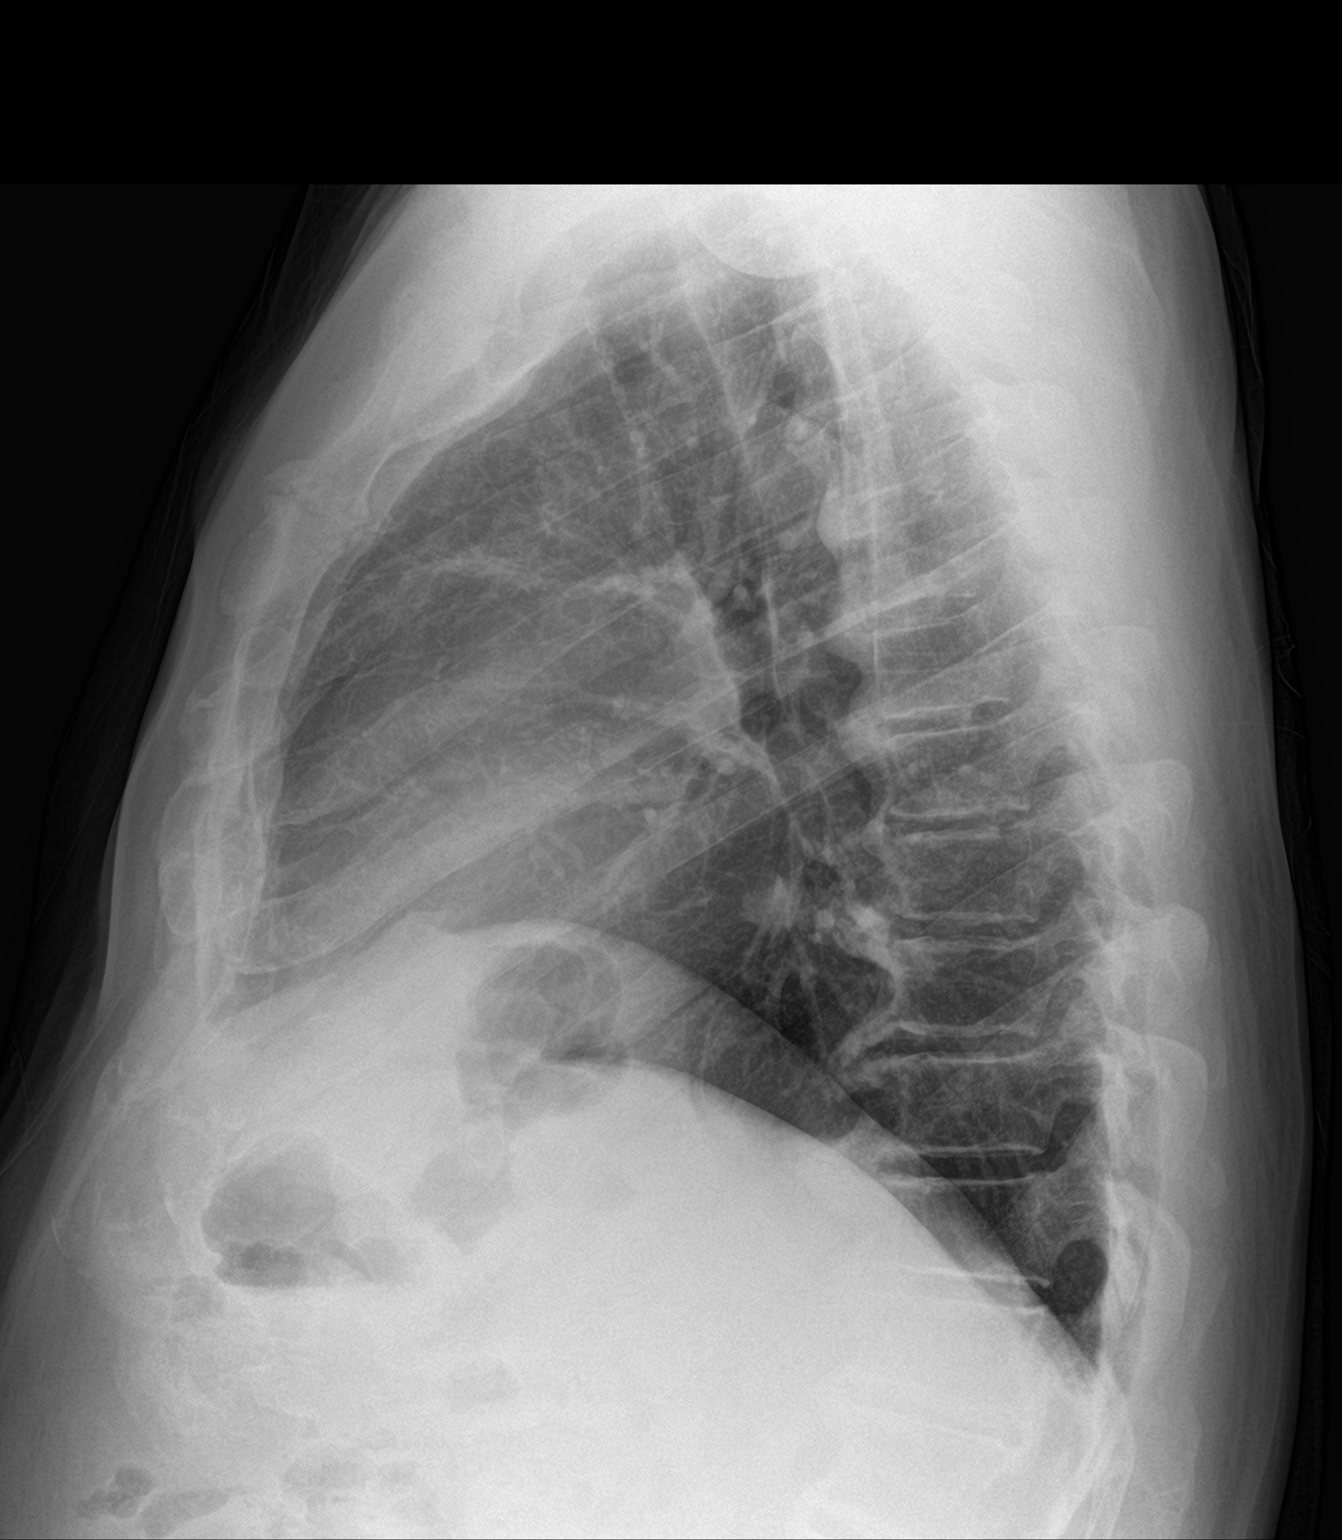

[2 of 2 positions shown; findings below may reference images not displayed]

FINDINGS: Scarring in the right upper lobe. Heart is upper limits normal in
size. No acute confluent airspace opacities or effusions. No acute
bony abnormality.
IMPRESSION: No active cardiopulmonary disease.

## 2018-10-05 IMAGING — DX DG KNEE 1-2V PORT*L*
2 series · 2 of 2 positions shown · non-contrast
Comparison: 04/24/2012

CLINICAL DATA: Status post left total knee replacement.

EXAM:
PORTABLE LEFT KNEE - 1-2 VIEW

[knee ap]
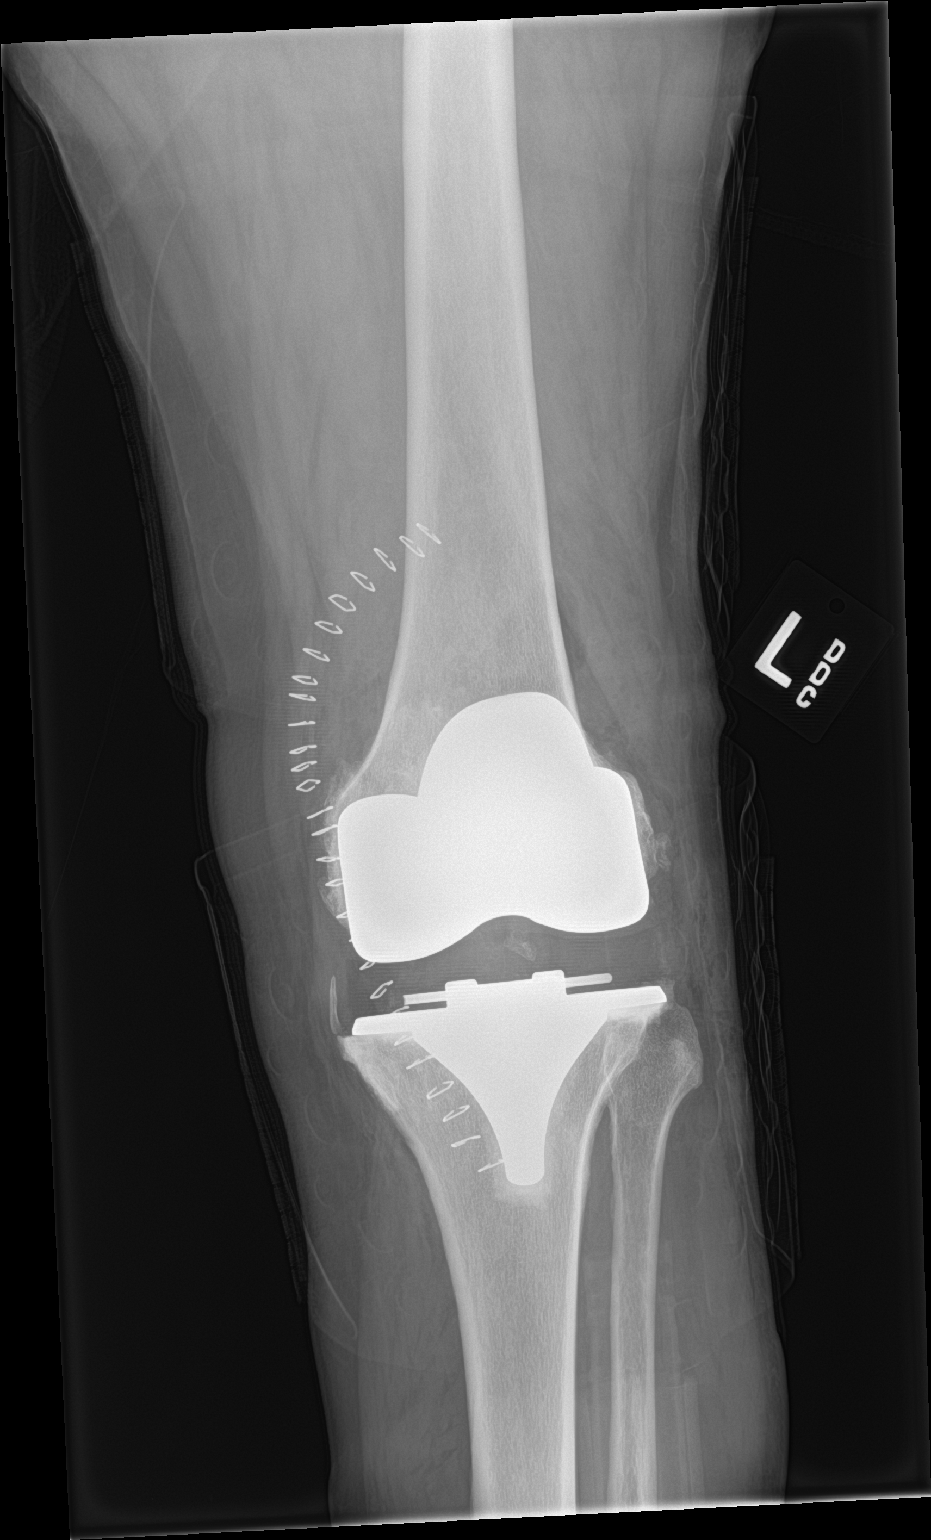

[knee lat]
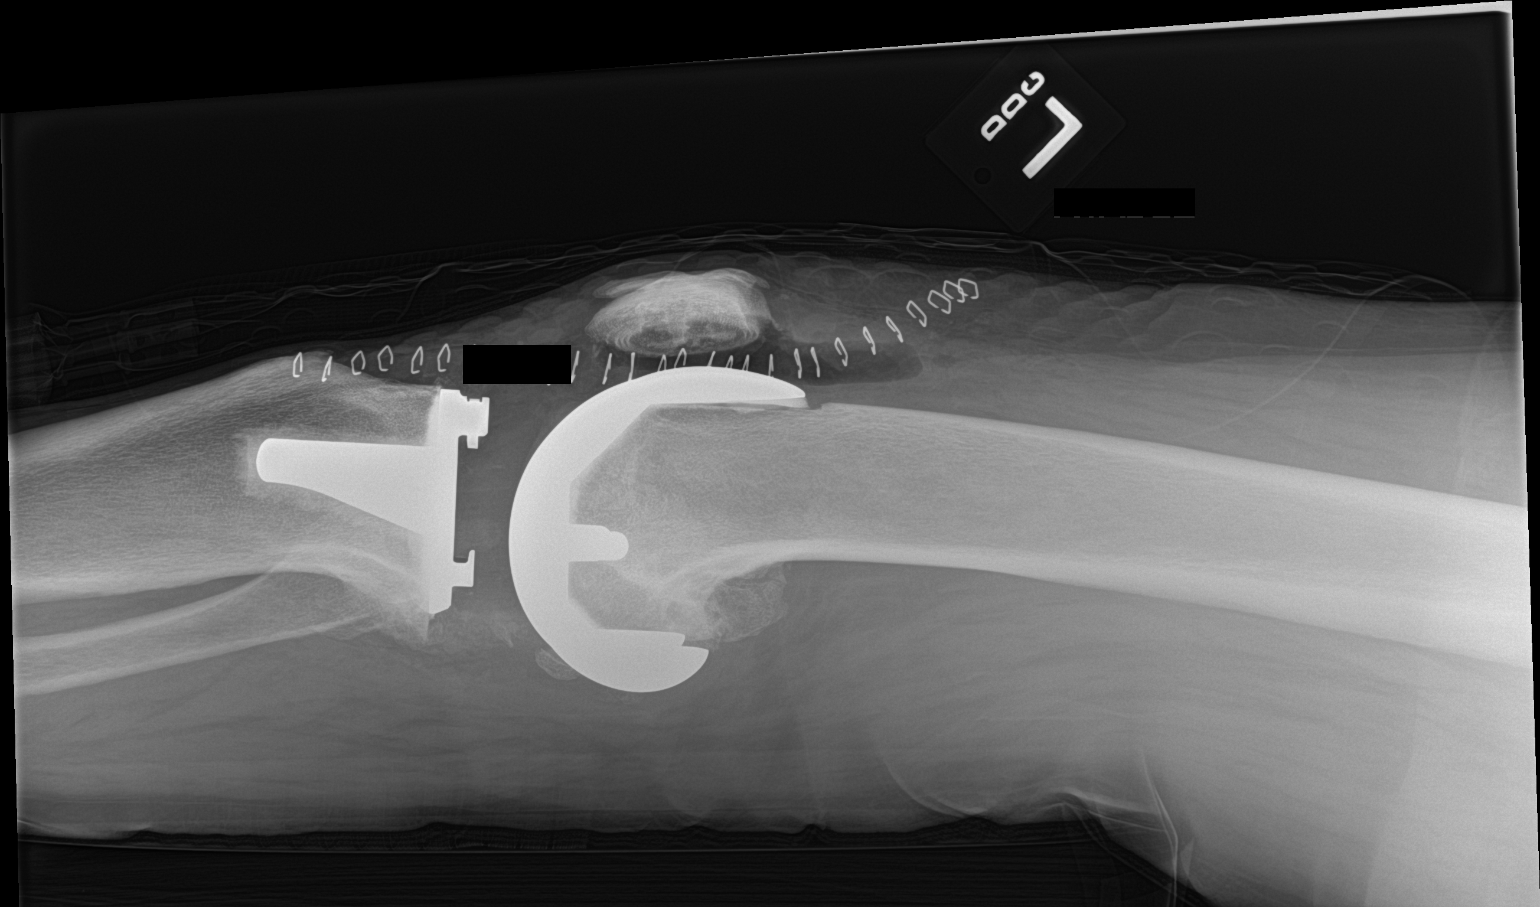

[2 of 2 positions shown; findings below may reference images not displayed]

FINDINGS: Sequelae of total knee arthroplasty are identified. Gas is noted in
the knee joint, and skin staples are in place. No acute fracture or
dislocation is identified.
IMPRESSION: Left total knee arthroplasty without evidence of immediate
complication.

## 2019-01-09 DIAGNOSIS — I480 Paroxysmal atrial fibrillation: Secondary | ICD-10-CM | POA: Diagnosis not present

## 2019-01-09 DIAGNOSIS — I2510 Atherosclerotic heart disease of native coronary artery without angina pectoris: Secondary | ICD-10-CM | POA: Diagnosis not present

## 2019-01-09 DIAGNOSIS — E782 Mixed hyperlipidemia: Secondary | ICD-10-CM | POA: Diagnosis not present

## 2019-01-10 DIAGNOSIS — I480 Paroxysmal atrial fibrillation: Secondary | ICD-10-CM | POA: Diagnosis not present

## 2019-01-10 DIAGNOSIS — Z7901 Long term (current) use of anticoagulants: Secondary | ICD-10-CM | POA: Diagnosis not present

## 2019-01-10 DIAGNOSIS — Z79899 Other long term (current) drug therapy: Secondary | ICD-10-CM | POA: Diagnosis not present

## 2019-01-10 DIAGNOSIS — Z5181 Encounter for therapeutic drug level monitoring: Secondary | ICD-10-CM | POA: Diagnosis not present

## 2019-01-25 IMAGING — CR DG CHEST 2V
1 series · 2 of 2 positions shown · non-contrast
Comparison: October 18, 2017

CLINICAL DATA: Preoperative for knee surgery

EXAM:
CHEST - 2 VIEW

[Series 1: dg chest 2 view · 0.14mm/px · 2 of 2 slices shown]
[im 1/2]
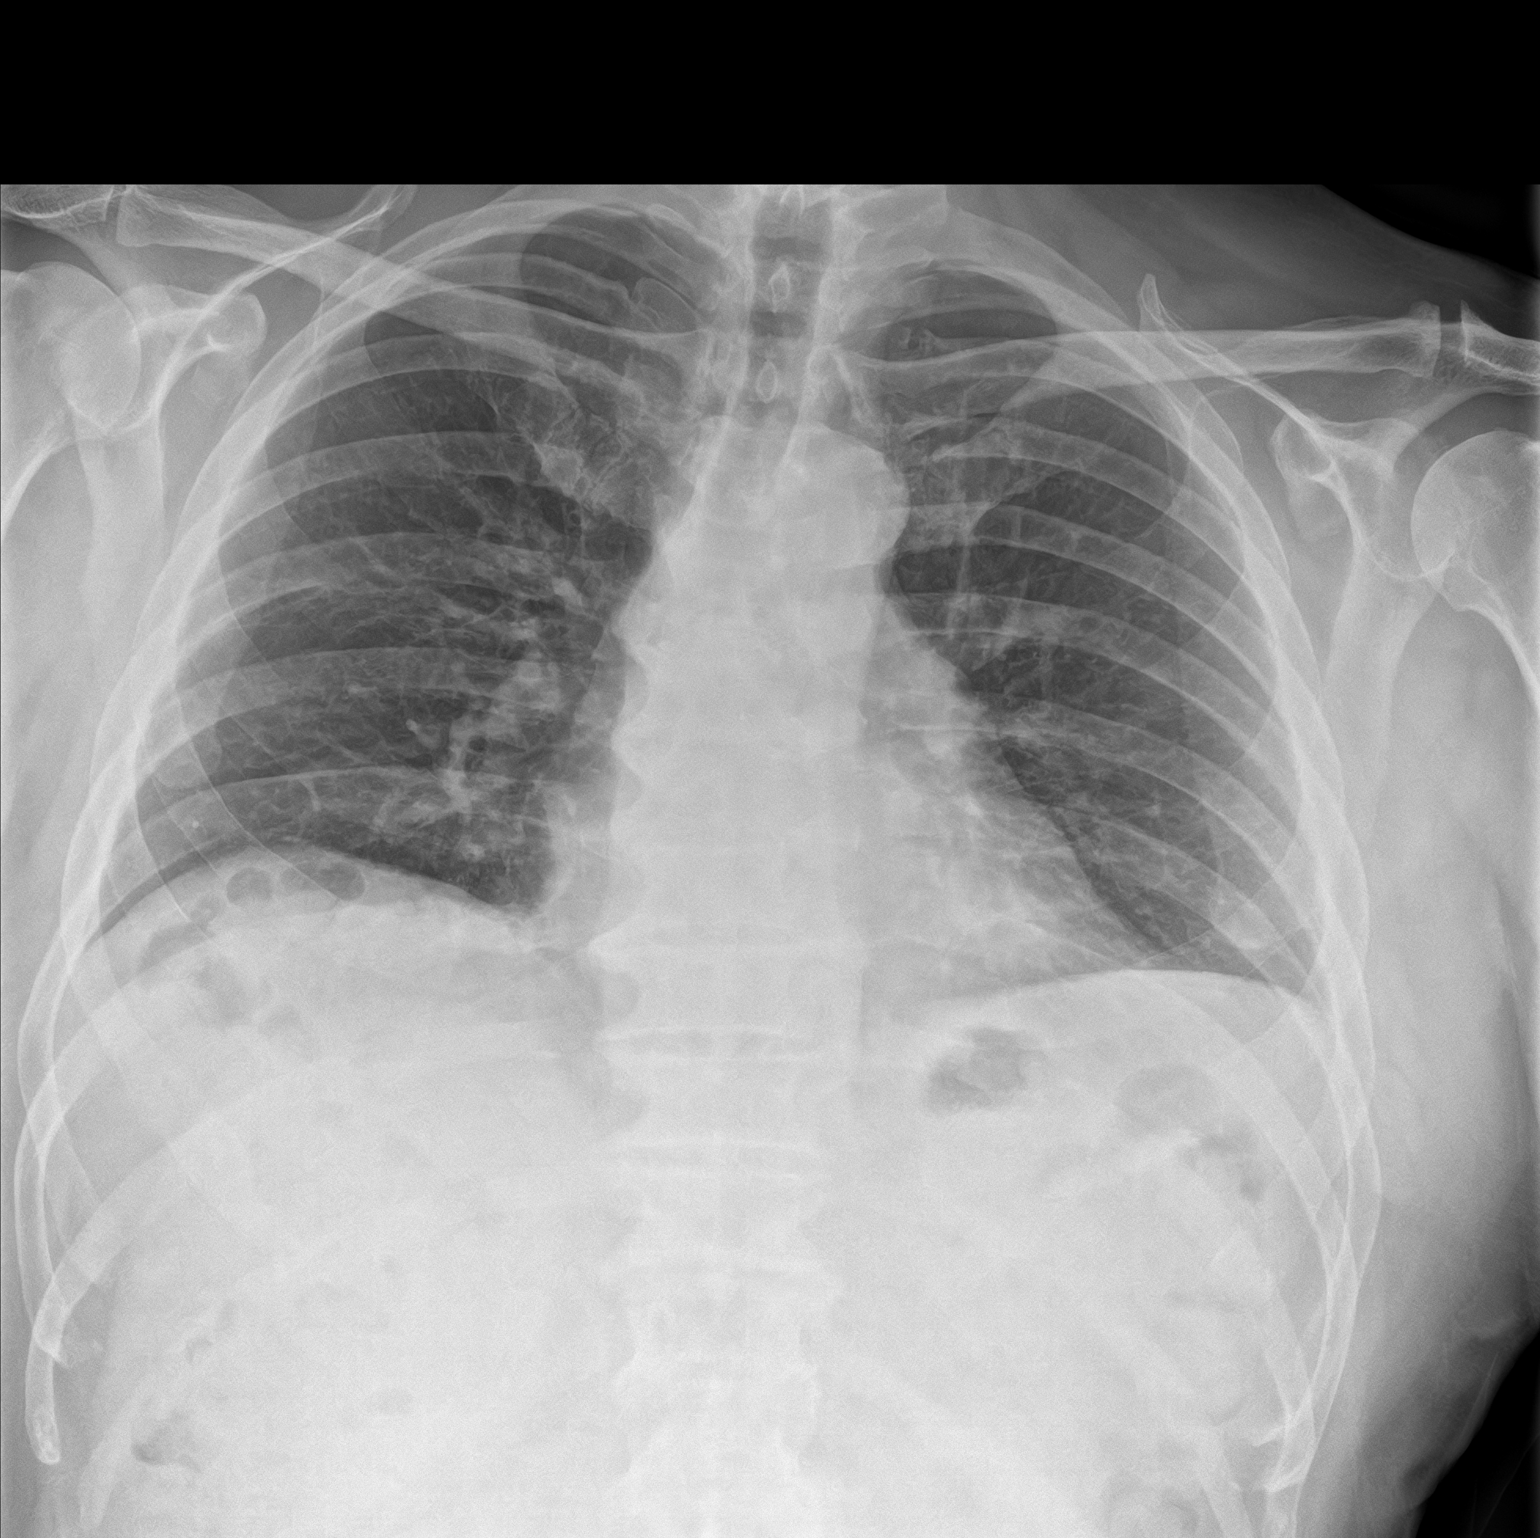
[im 2/2]
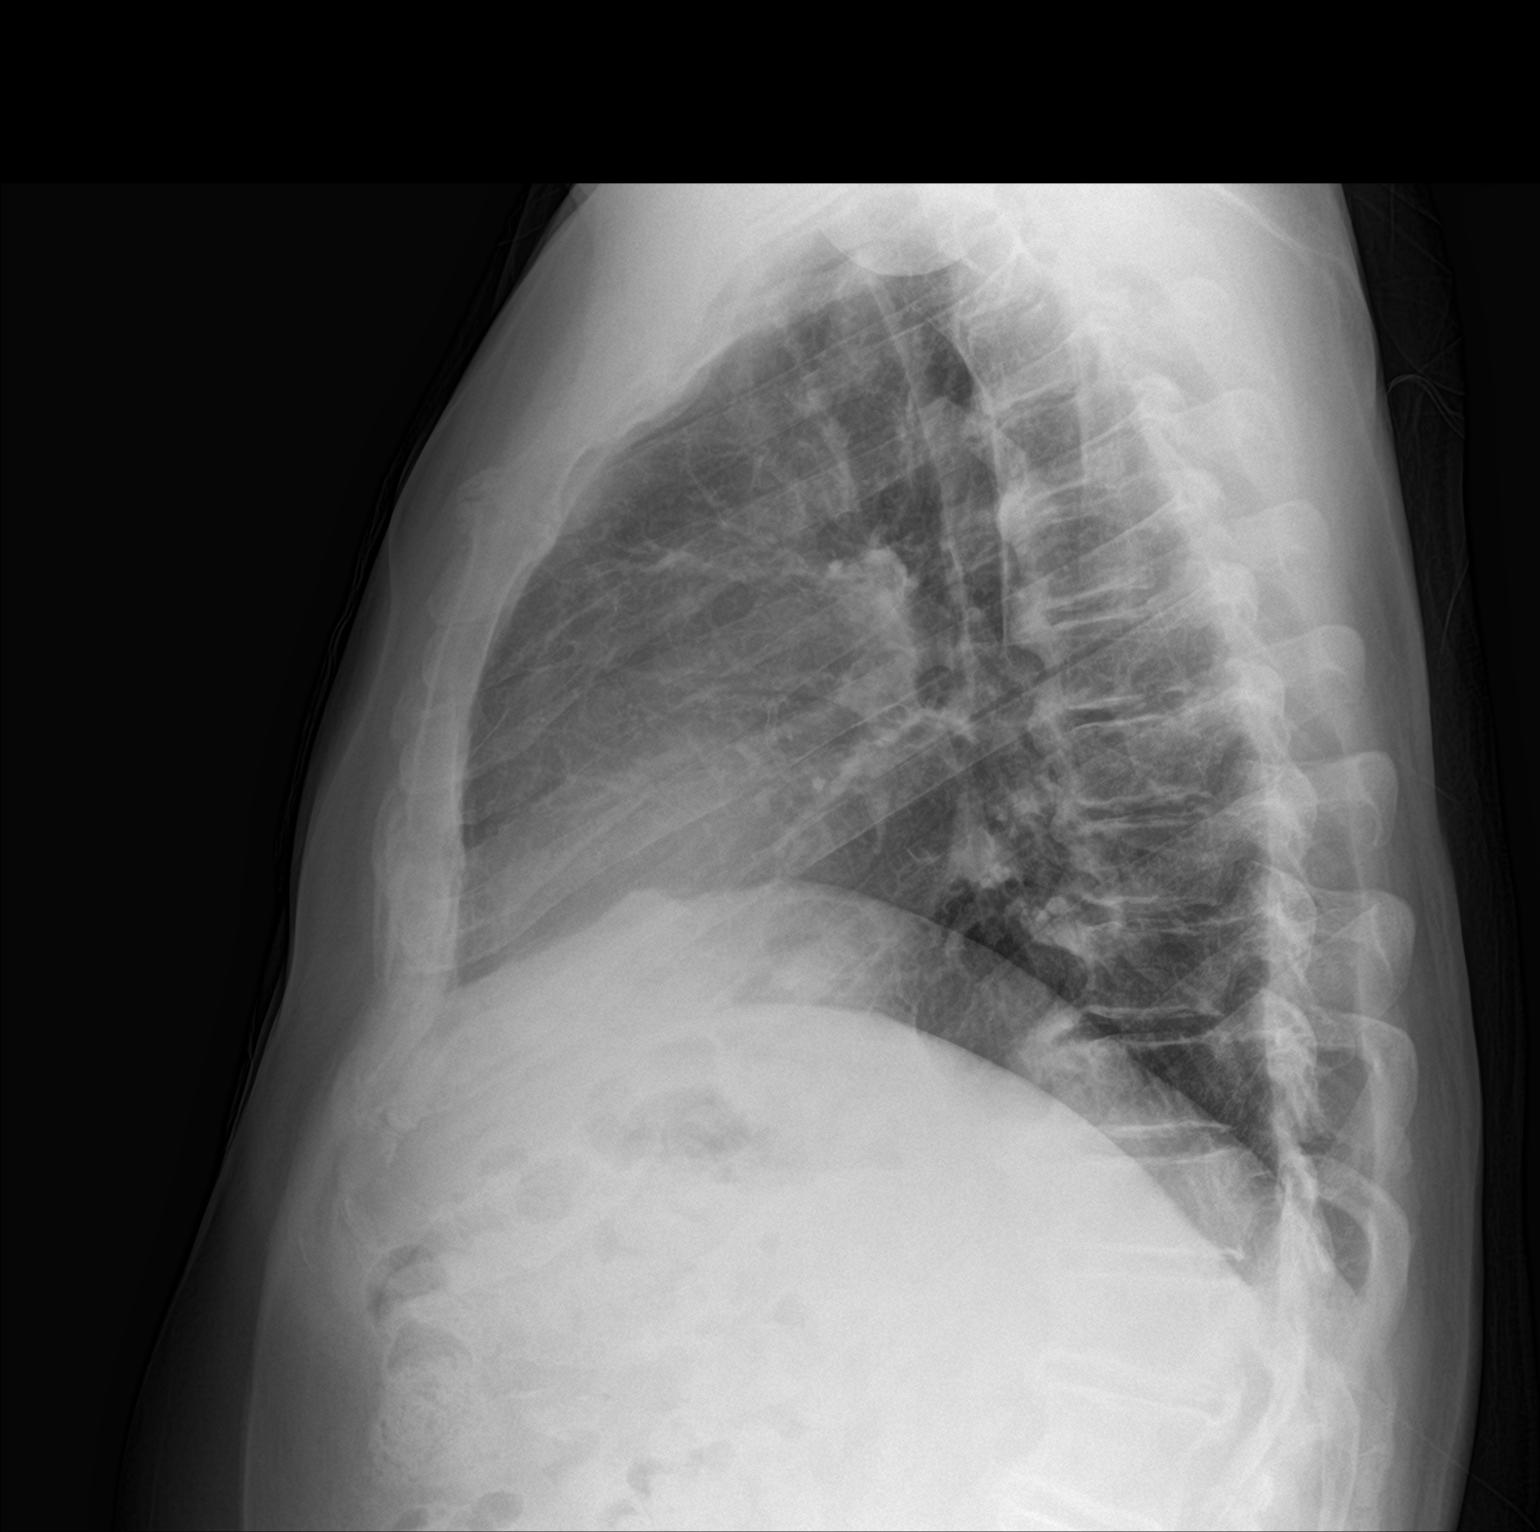

[2 of 2 positions shown; findings below may reference images not displayed]

FINDINGS: The heart size and mediastinal contours are within normal limits.
Both lungs are clear. Degenerative joint changes of the spine with
scoliosis of spine are noted.
IMPRESSION: No active cardiopulmonary disease.

## 2019-03-12 DIAGNOSIS — E782 Mixed hyperlipidemia: Secondary | ICD-10-CM | POA: Diagnosis not present

## 2019-03-12 DIAGNOSIS — I2510 Atherosclerotic heart disease of native coronary artery without angina pectoris: Secondary | ICD-10-CM | POA: Diagnosis not present

## 2019-03-12 DIAGNOSIS — I480 Paroxysmal atrial fibrillation: Secondary | ICD-10-CM | POA: Diagnosis not present

## 2019-07-12 DIAGNOSIS — Z5329 Procedure and treatment not carried out because of patient's decision for other reasons: Secondary | ICD-10-CM | POA: Diagnosis not present

## 2019-07-12 DIAGNOSIS — U071 COVID-19: Secondary | ICD-10-CM | POA: Diagnosis not present

## 2019-07-12 DIAGNOSIS — Z8616 Personal history of COVID-19: Secondary | ICD-10-CM

## 2019-07-12 DIAGNOSIS — R05 Cough: Secondary | ICD-10-CM | POA: Diagnosis not present

## 2019-07-12 DIAGNOSIS — R918 Other nonspecific abnormal finding of lung field: Secondary | ICD-10-CM | POA: Diagnosis not present

## 2019-07-12 HISTORY — DX: Personal history of COVID-19: Z86.16

## 2019-07-28 ENCOUNTER — Encounter: Payer: Self-pay | Admitting: Emergency Medicine

## 2019-07-28 ENCOUNTER — Ambulatory Visit
Admission: EM | Admit: 2019-07-28 | Discharge: 2019-07-28 | Disposition: A | Discharge status: Home / Self Care | Payer: PPO | Attending: Urgent Care | Admitting: Urgent Care

## 2019-07-28 ENCOUNTER — Other Ambulatory Visit: Payer: Self-pay

## 2019-07-28 ENCOUNTER — Ambulatory Visit (INDEPENDENT_AMBULATORY_CARE_PROVIDER_SITE_OTHER): Payer: PPO

## 2019-07-28 DIAGNOSIS — J1282 Pneumonia due to coronavirus disease 2019: Secondary | ICD-10-CM

## 2019-07-28 DIAGNOSIS — R05 Cough: Secondary | ICD-10-CM

## 2019-07-28 DIAGNOSIS — R0602 Shortness of breath: Secondary | ICD-10-CM | POA: Diagnosis not present

## 2019-07-28 DIAGNOSIS — U071 COVID-19: Secondary | ICD-10-CM | POA: Diagnosis not present

## 2019-07-28 DIAGNOSIS — R0902 Hypoxemia: Secondary | ICD-10-CM

## 2019-07-28 DIAGNOSIS — R059 Cough, unspecified: Secondary | ICD-10-CM

## 2019-07-28 MED ORDER — PREDNISONE 50 MG PO TABS: 50.0000 mg | ORAL_TABLET | Freq: Every day | ORAL | 0 refills | Status: AC

## 2019-07-28 MED ORDER — LEVOFLOXACIN 750 MG PO TABS: 750.0000 mg | ORAL_TABLET | Freq: Every day | ORAL | 0 refills | Status: AC

## 2019-07-28 MED ORDER — HYDROCOD POLST-CPM POLST ER 10-8 MG/5ML PO SUER: 5.0000 mL | Freq: Two times a day (BID) | ORAL | 0 refills | Status: DC | PRN

## 2019-07-28 NOTE — Discharge Instructions (Addendum)
It was very nice seeing you today in clinic. Thank you for entrusting me with your care.   Recommending to you go the hospital. You indicated that you did not want to do this. Will attempt to treat you at home. Rest and Stay HYDRATED. Water and electrolyte containing beverages (Gatorade, Pedialyte) are best to prevent dehydration and electrolyte abnormalities.  Make efforts to take deep breathes every so often to help expand lungs. Please utilize the medications that we discussed. Your prescriptions has been called in to your pharmacy. If you are worse, you need to go to the ER or call 911.   Make arrangements to follow up with your regular doctor in 1 week for re-evaluation if not improving. If your symptoms/condition worsens, please seek follow up care either here or in the ER. Please remember, our [place] providers are "right here with you" when you need [place].   Again, it was my pleasure to take care of you today. Thank you for choosing our clinic. I hope that you start to feel better quickly.   Honor Loh, MSN, APRN, FNP-C, CEN Advanced Practice Provider [facility]

## 2019-07-28 NOTE — ED Triage Notes (Signed)
Patient was diagnosed with COVID on 07/16/19.  Patient states that he has ongoing cough and SOB.  Patient denies fevers.

## 2019-07-28 NOTE — ED Provider Notes (Signed)
[place], [place]   Name: Stephen Erickson DOB: 12/07/1955 MRN: [id number] CSN: [id number] PCP: [facility]  Arrival date and time:  07/28/19 1127  Chief Complaint:  Shortness of Breath and Cough   NOTE: Prior to seeing the patient today, I have reviewed the triage nursing documentation and vital signs. Clinical staff has updated patient's PMH/PSHx, current medication list, and drug allergies/intolerances to ensure comprehensive history available to assist in medical decision making.   History:   HPI: Stephen Erickson is a 64 y.o. male who presents today with complaints of fatigue, cough, and worsening shortness of breath following his diagnosis of SARS-CoV-2 (novel coronavirus) on 07/12/2019, which makes him currently day 16 out from initial diagnosis. Patient was seen and diagnosed at [facility]. Patient advising that he ended up leaving the hospital's emergency department (under AMA status) citing the fact that they were "taking too long". At that time, patient was having nausea, vomiting, and diarrhea. These symptoms have resolved at this point and patient is eating and drinking well. He reports that his respiratory symptoms continue to be bothersome. Patient states, "I haven't been able to sleep in 2 weeks" due to cough and associated shortness of breath. Cough has been productive of clear sputum; no wheezing. Patient with intermittent posterior chest wall pain mainly associated with cough and deep inspiration. Patient contacted PCP on 07/16/2019 requesting "cough syrup with codeine"; prescription for Hycodan was sent. Patient advising that this intervention did not really help help to be able to rest, however it did intermittently help with his cough. Patient denies any fevers or chills. He states, "I just feel like shit". Patient presents to the clinic today tachycardic to 119 bpm and hypoxic at 90% on room air oximetry. He does not appear to be in any acute distress upon arrival.    Past Medical History:  Diagnosis Date  . Angina pectoris ([place])   . Arthritis   . Atrial fibrillation ([place])   . Coronary artery disease   . Dysrhythmia   . History of 2019 novel coronavirus disease (COVID-19) 07/12/2019   Diagnosed at [facility]  . Hyperlipidemia   . Hypertension   . Myocardial infarction ([place]) 2011  . PAC (premature atrial contraction)   . Pneumonia   . Tachy-brady syndrome ([place])   . Vertebral artery occlusion, left     Past Surgical History:  Procedure Laterality Date  . ANGIOPLASTY    . CARDIAC CATHETERIZATION    . CORONARY ANGIOPLASTY    . HERNIA REPAIR    . KNEE ARTHROSCOPY Left 1979  . TOTAL KNEE ARTHROPLASTY Left 10/31/2017   Procedure: TOTAL KNEE ARTHROPLASTY;  Surgeon: Corky Mull, MD;  Location: [facility] ORS;  Service: Orthopedics;  Laterality: Left;  . TOTAL KNEE ARTHROPLASTY Right 03/06/2018   Procedure: TOTAL KNEE ARTHROPLASTY;  Surgeon: Corky Mull, MD;  Location: [facility] ORS;  Service: Orthopedics;  Laterality: Right;    History reviewed. No pertinent family history.  Social History   Tobacco Use  . Smoking status: Never Smoker  . Smokeless tobacco: Former Network engineer Use Topics  . Alcohol use: No  . Drug use: No    Patient Active Problem List   Diagnosis Date Noted  . Status post total knee replacement using cement, right 03/06/2018  . Status post total knee replacement using cement, left 10/31/2017    Home Medications:    Current Meds  Medication Sig  . apixaban (ELIQUIS) 5 MG TABS tablet Take 5 mg by mouth 2 (  two) times daily.  Marland Kitchen aspirin 81 MG chewable tablet Chew 81 mg by mouth daily.   Marland Kitchen atorvastatin (LIPITOR) 80 MG tablet Take 80 mg by mouth at bedtime.   . cyclobenzaprine (FLEXERIL) 5 MG tablet Take 1 tablet (5 mg total) by mouth 3 (three) times daily as needed for muscle spasms.  Marland Kitchen diltiazem (CARDIZEM LA) 120 MG 24 hr tablet Take 120 mg by mouth daily.  . metoprolol succinate (TOPROL-XL) 25 MG 24 hr tablet Take 25 mg by  mouth daily.  Marland Kitchen oxyCODONE (OXY IR/ROXICODONE) 5 MG immediate release tablet Take 1-2 tablets (5-10 mg total) by mouth every 4 (four) hours as needed for moderate pain.  . sotalol (BETAPACE) 80 MG tablet Take 80 mg by mouth 2 (two) times daily.  . traMADol (ULTRAM) 50 MG tablet Take 1 tablet (50 mg total) by mouth every 6 (six) hours as needed for moderate pain.    Allergies:   Lisinopril  Review of Systems (ROS):  Review of systems NEGATIVE unless otherwise noted in narrative H&P section.   Vital Signs: Today's Vitals   07/28/19 1139 07/28/19 1232 07/28/19 1233 07/28/19 1245  BP: 137/90     Pulse: (!) 119 98    Resp: 18     Temp: 98.2 F (36.8 C)     TempSrc: Oral     SpO2: 90% 91% (!) 86%   Weight:      Height:      PainSc:    0-No pain    Physical Exam: Physical Exam  Constitutional: He is oriented to person, place, and time and well-developed, well-nourished, and in no distress.  Acutely ill appearing; fatigued/listless.  HENT:  Head: Normocephalic and atraumatic.  Eyes: Pupils are equal, round, and reactive to light.  Cardiovascular: Normal rate, regular rhythm, normal heart sounds and intact distal pulses.  Pulmonary/Chest: Effort normal. He has decreased breath sounds (throughout). He has wheezes (mild expiratory; scattered). He has rhonchi (throughout; clears somewhat with forceful coughing).  Moderate cough noted in clinic. No SOB or increased WOB. No distress. Able to speak in complete sentences without difficulties. SPO2 90% at rest and 86% with exertion on RA.   Abdominal: Soft. Normal appearance and bowel sounds are normal. He exhibits no distension. There is no abdominal tenderness.  Musculoskeletal:     Cervical back: Normal range of motion and neck supple.  Lymphadenopathy:    He has no cervical adenopathy.  Neurological: He is alert and oriented to person, place, and time. He has normal sensation and normal strength. Gait normal.  Skin: Skin is warm and  dry. No rash noted. He is not diaphoretic.  Psychiatric: Mood, memory, affect and judgment normal.  Nursing note and vitals reviewed.   Urgent Care Treatments / Results:   Orders Placed This Encounter  Procedures  . DG Chest 2 View  . Recheck vitals  . Check Pulse Oximetry while ambulating    LABS: PLEASE NOTE: all labs that were ordered this encounter are listed, however only abnormal results are displayed. Labs Reviewed - No data to display  EKG: -None  RADIOLOGY: DG Chest 2 View  Result Date: 07/28/2019 CLINICAL DATA:  Cough and shortness of breath hypoxia and tachycardia. COVID-19 virus infection. EXAM: CHEST - 2 VIEW COMPARISON:  02/20/2018 FINDINGS: Heart size is normal. Low lung volumes are again seen. New multifocal areas of airspace opacity are seen in both lungs, right side greater than left, suspicious for atypical or viral pneumonia. No evidence of pleural effusion. IMPRESSION:  Multifocal airspace opacities, right side greater than left, suspicious for atypical or viral pneumonia. Electronically Signed   By: Marlaine Hind M.D.   On: 07/28/2019 12:21    PROCEDURES: Procedures  MEDICATIONS RECEIVED THIS VISIT: Medications - No data to display  PERTINENT CLINICAL COURSE NOTES/UPDATES:   Initial Impression / Assessment and Plan / Urgent Care Course:  Pertinent labs & imaging results that were available during my care of the patient were personally reviewed by me and considered in my medical decision making (see lab/imaging section of note for values and interpretations).  Stephen Erickson is a 64 y.o. male who presents to [facility] today with complaints of Shortness of Breath and Cough  Patient acutely ill appearing (non-toxic) appearing in clinic today. He does not appear to be in any acute distress. He does not appear to be in any acute distress. Presenting symptoms (see HPI) and exam as documented above. Patient is day 76 s/p SARS-CoV-2 (novel coronavirus)  diagnosis. He presents with fatigue, worsening cough, and increased shortness of breath. He presents with borderline hypoxia; initial oxygen saturation 90% on room air. Will pursue workup as follows:   Ambulatory oximetry monitoring reveals worsening DOE and resulting hypoxia. Saturations drop to 86% on RA.    Radiographs of the chest revealed multifocal opacities (R>L) consistent with atypical vs. viral PNA. Results compared to CXR done at time of SARS-CoV-2 diagnosis on 07/12/19, at which time the scattered opacities were identified. I am unable to see the actual images as he was diagnosed at [facility]. I suspect that presentation and findings on 07/12/19 would have results in patient being admitted to the hospital, however he signed himself out under [place] status per available documentation.   Discussed findings with patient. I stressed by concerns of worsening status overall. Patient has a significant PMH, which certainly increases his morbidity and mortality risk associated with his current clinical condition (SARS-CoV-2 pneumonia). Patient is borderline hypoxic at rest, with further worsening of his saturations noted with minimal exertion. Concern for worsening pneumonia leading to hypoxic respiratory failure. Patient also at risk for sepsis. Patient's clinical condition today is felt to require further evaluation and intervention in a setting that is capable of providing a higher level of care. Patient is going to need labs, IVFs, IV medications, and ultimately hospitilization. I discussed with patient that his problems today are outside of the capabilities of this outpatient urgent care setting, thus my recommendations are to have him seen in the emergency department at either [facility] or [facility]. Patient adamantly refuses to go to the hospital. I reiterated my concerns with the patient and advised that failure to pursue further care could very well likely result in worsening of his condition and  death. Patient remains adamant that he does not wish to go to the hospital. Patient states, "give me some medicine and if I get worse I will come back and you can send me to the hospital then if I still need it". I stressed to him that this is not in his best interest, to which patient states, "I know, but I am not going". Patient made aware that his decision constitutes yet another AMA situation, and there is anticipation of negative outcomes based on clinical exam and findings today. Patient verbalizes understanding. In light of his decision, will proceed as follows:   Labs (blood and urine) considered. Patient wishes to defer; refuses. Most recent labs (07/12/2019) from [facility] reviewed  BNP 1106 pg/mL  Na 131, K+  3.8, glucose 115, BUN 21, and creatinine 1.3 mg/dL. eGFR 58 mL/min.   WBC 5.6, Hgb 16.9, Hct 49, MCV 88, MCH 30.2, and platelets 206.   Will treat for possible bacterial etiology with a 7 day course of levofloxacin 750 mg and a 5 day steroid burst (predinisone 50 mg daily). Patient with significant cough that is worse at night. He is not sleeping. Previous Hycodan minimally effective. Will change to Tussionex for PRN use. Advised to exercise caution with the use of this medication as it can cause somnolence.   Discussed supportive care measures at home during acute phase of illness. Patient to rest as much as possible. He was encouraged to ensure adequate hydration (water and ORS) to prevent dehydration and electrolyte derangements. Patient may use APAP and/or IBU on an as needed basis for pain/fever.   Discussed follow up with primary care physician in 1 week, or sooner as needed, for re-evaluation. I have reviewed the follow up and strict return precautions for any new or worsening symptoms. Patient is aware of symptoms that would be deemed urgent/emergent, and would thus require further evaluation in the emergency department. I have advised him that he needs to be hospitalized, and due  to the probable viral etiology of his pneumonia, the aforementioned interventions may not be effective. If he worsens, he should proceed to the emergency room, preferably via EMS transport, for further evaluation and treatment. At the time of discharge, he verbalized understanding and consent with the discharge plan as it was reviewed with him. All questions were fielded by provider and/or clinic staff prior to patient discharge.    Final Clinical Impressions / Urgent Care Diagnoses:   Final diagnoses:  Pneumonia due to COVID-19 virus  Cough  Hypoxia    New Prescriptions:  [place] Controlled Substance Registry consulted? Yes, I have consulted the [place] Controlled Substances Registry for this patient, and feel the risk/benefit ratio today is favorable for proceeding with this prescription for a controlled substance.  . Discussed use of controlled substance medication to treat his acute symptoms.  o Reviewed [place] STOP Act regulations  o Clinic does not refill controlled substances over the phone without face to face evaluation.  . Safety precautions reviewed.  o Medications should not be sold or taken with alcohol.  o Avoid use while working, driving, or operating heavy machinery.  o Side effects associated with the use of this particular medication reviewed. - Patient understands that this medication can cause CNS depression, increase his risk of falls, and even lead to overdose that may result in death, if used outside of the parameters that he and I discussed.  With all of this in mind, he knowingly accepts the risks and responsibilities associated with intended course of treatment, and elects to responsibly proceed as discussed.  Meds ordered this encounter  Medications  . levofloxacin (LEVAQUIN) 750 MG tablet    Sig: Take 1 tablet (750 mg total) by mouth daily for 7 days.    Dispense:  7 tablet    Refill:  0  . predniSONE (DELTASONE) 50 MG tablet    Sig: Take 1 tablet (50 mg total) by mouth  daily for 5 days.    Dispense:  5 tablet    Refill:  0  . chlorpheniramine-HYDROcodone (TUSSIONEX PENNKINETIC ER) 10-8 MG/5ML SUER    Sig: Take 5 mLs by mouth every 12 (twelve) hours as needed for cough. Will causes drowsiness; NO DRIVING.    Dispense:  70 mL  Refill:  0    Recommended Follow up Care:  Patient encouraged to follow up with the following provider within the specified time frame, or sooner as dictated by the severity of his symptoms. As always, he was instructed that for any urgent/emergent care needs, he should seek care either here or in the emergency department for more immediate evaluation.  Follow-up Information    [place] In 1 week.   Why: General reassessment of symptoms if not improving Contact information: [address] [place] [address] [place]  [address] [phone]        Go to  [facility] [place].   Specialty: Emergency Medicine Why: Call 911 or go to the ER if if symptoms worsen. Contact information: [place] [id number] [address] [place] [phone]        NOTE: This note was prepared using Dragon dictation software along with smaller phrase technology. Despite my best ability to proofread, there is the potential that transcriptional errors may still occur from this process, and are completely unintentional.    Karen Kitchens, NP 07/29/19 0147

## 2019-08-14 ENCOUNTER — Other Ambulatory Visit: Payer: Self-pay

## 2019-08-14 ENCOUNTER — Ambulatory Visit (INDEPENDENT_AMBULATORY_CARE_PROVIDER_SITE_OTHER): Payer: PPO

## 2019-08-14 ENCOUNTER — Encounter: Payer: Self-pay | Admitting: Emergency Medicine

## 2019-08-14 ENCOUNTER — Ambulatory Visit
Admission: EM | Admit: 2019-08-14 | Discharge: 2019-08-14 | Disposition: A | Discharge status: Home / Self Care | Payer: PPO

## 2019-08-14 DIAGNOSIS — Z8616 Personal history of COVID-19: Secondary | ICD-10-CM

## 2019-08-14 DIAGNOSIS — U071 COVID-19: Secondary | ICD-10-CM

## 2019-08-14 DIAGNOSIS — R0789 Other chest pain: Secondary | ICD-10-CM

## 2019-08-14 DIAGNOSIS — R059 Cough, unspecified: Secondary | ICD-10-CM

## 2019-08-14 DIAGNOSIS — J1282 Pneumonia due to coronavirus disease 2019: Secondary | ICD-10-CM | POA: Diagnosis not present

## 2019-08-14 DIAGNOSIS — R079 Chest pain, unspecified: Secondary | ICD-10-CM | POA: Diagnosis not present

## 2019-08-14 DIAGNOSIS — R05 Cough: Secondary | ICD-10-CM

## 2019-08-14 MED ORDER — BENZONATATE 100 MG PO CAPS: 100.0000 mg | ORAL_CAPSULE | Freq: Three times a day (TID) | ORAL | 0 refills | Status: AC | PRN

## 2019-08-14 MED ORDER — HYDROCOD POLST-CPM POLST ER 10-8 MG/5ML PO SUER: 5.0000 mL | Freq: Two times a day (BID) | ORAL | 0 refills | Status: AC | PRN

## 2019-08-14 NOTE — Discharge Instructions (Addendum)
It was very nice seeing you today in clinic. Thank you for entrusting me with your care.   Chest film has not really improved. I have sent in some additional medication for your cough. I recommend that you see pulmonology. I have given you the name of a local provider. You will need to call and schedule an appointment.   If your symptoms/condition worsens, please seek follow up care either here or in the ER. Please remember, our [place] providers are "right here with you" when you need [place]. Again, it was my pleasure to take care of you today. Thank you for choosing our clinic. I hope that you start to feel better quickly.   Honor Loh, MSN, APRN, FNP-C, CEN Advanced Practice Provider [facility]

## 2019-08-14 NOTE — ED Triage Notes (Signed)
Patient  In today c/o continued cough. Patient diagnosed with Covid on 07/16/19. Patient seen at [facility] on 07/28/19 for cough and diagnosed with pneumonia due to covid. Patient states cough continues and get worse at night time when he lays down.

## 2019-08-15 DIAGNOSIS — J9621 Acute and chronic respiratory failure with hypoxia: Secondary | ICD-10-CM | POA: Diagnosis not present

## 2019-08-16 NOTE — ED Provider Notes (Signed)
[place]   Name: Stephen Erickson DOB: 1955/08/22 MRN: [id number] CSN: [id number] PCP: [facility]  Arrival date and time:  08/14/19 1225  Chief Complaint:  Cough  NOTE: Prior to seeing the patient today, I have reviewed the triage nursing documentation and vital signs. Clinical staff has updated patient's PMH/PSHx, current medication list, and drug allergies/intolerances to ensure comprehensive history available to assist in medical decision making.   History:   HPI: Stephen Erickson is a 64 y.o. male who presents today with complaints of fatigue, persistent cough, and pleuritic chest pain following his diagnosis of SARS-CoV-2 (novel coronavirus) on 07/12/2019. Patient notes that his cough is exacerbated with ambulation. He was seen here on 07/28/2019 by be; notes reviewed. CXR revealed multi-focal opacities consistent with atypical vs. viral PNA. He was hypoxic and tachycardic. Recommendations were made for patient to go to the hospital for admission, however he refused. Attempts were made to cover patient as an outpatient with a 7 day course of levofloxacin 750 mg, 5 day steroid burst (prednisone 50 mg), and Tussionex. Patient presents today for re-evaluation citing that his cough continues. Clinically he has improved. He has continued exertional dyspnea, however his stamina is reported to be better. VS are much more stable today; HR 67 bpm and oxygen saturations 96% on RA. Since his last visit, he denies that he has experienced any nausea, vomiting, diarrhea, or abdominal pain. He is eating and drinking well. Patient continues to report difficulties sleepingdue to cough. Cough has been productive of clear sputum; no wheezing. Patient states, "I spit out 2 or 3 ounces of clear stuff last night into a Pepsi bottle while I was in bed". Patient with intermittent posterior chest wall pain mainly associated with cough and deep inspiration. He reports that his cough did improve  with the prescribed Tussionex, however relief was short lived and only lasted for about 1 hour after taking it. Patient denies any fevers or chills. He does not appear to be in any acute distress upon arrival.   Past Medical History:  Diagnosis Date  . Angina pectoris ([place])   . Arthritis   . Atrial fibrillation ([place])   . Coronary artery disease   . Dysrhythmia   . History of 2019 novel coronavirus disease (COVID-19) 07/12/2019   Diagnosed at [facility]  . Hyperlipidemia   . Hypertension   . Myocardial infarction ([place]) 2011  . PAC (premature atrial contraction)   . Pneumonia   . Tachy-brady syndrome ([place])   . Vertebral artery occlusion, left     Past Surgical History:  Procedure Laterality Date  . ANGIOPLASTY    . CARDIAC CATHETERIZATION    . CORONARY ANGIOPLASTY    . HERNIA REPAIR    . KNEE ARTHROSCOPY Left 1979  . TOTAL KNEE ARTHROPLASTY Left 10/31/2017   Procedure: TOTAL KNEE ARTHROPLASTY;  Surgeon: Corky Mull, MD;  Location: [facility] ORS;  Service: Orthopedics;  Laterality: Left;  . TOTAL KNEE ARTHROPLASTY Right 03/06/2018   Procedure: TOTAL KNEE ARTHROPLASTY;  Surgeon: Corky Mull, MD;  Location: [facility] ORS;  Service: Orthopedics;  Laterality: Right;    Family History  Problem Relation Age of Onset  . Pneumonia Mother   . Other Father        MVA  . Heart attack Father     Social History   Tobacco Use  . Smoking status: Never Smoker  . Smokeless tobacco: Former Network engineer Use Topics  . Alcohol use: No  .  Drug use: No    Patient Active Problem List   Diagnosis Date Noted  . Status post total knee replacement using cement, right 03/06/2018  . Status post total knee replacement using cement, left 10/31/2017    Home Medications:    Current Meds  Medication Sig  . apixaban (ELIQUIS) 5 MG TABS tablet Take 5 mg by mouth 2 (two) times daily.  . cyclobenzaprine (FLEXERIL) 5 MG tablet Take 1 tablet (5 mg total) by mouth 3 (three) times daily as needed for muscle  spasms.  Marland Kitchen diltiazem (CARDIZEM LA) 120 MG 24 hr tablet Take 120 mg by mouth daily.  . metoprolol succinate (TOPROL-XL) 25 MG 24 hr tablet Take 25 mg by mouth daily.  . nitroGLYCERIN (NITROSTAT) 0.4 MG SL tablet SMARTSIG:1 Tablet(s) Sublingual As Needed  . rosuvastatin (CRESTOR) 40 MG tablet Take 1 tablet by mouth daily.  . sotalol (BETAPACE) 80 MG tablet Take 80 mg by mouth 2 (two) times daily.  . traMADol (ULTRAM) 50 MG tablet Take 1 tablet (50 mg total) by mouth every 6 (six) hours as needed for moderate pain.    Allergies:   Atorvastatin and Lisinopril  Review of Systems (ROS):  Review of systems NEGATIVE unless otherwise noted in narrative H&P section.   Vital Signs: Today's Vitals   08/14/19 1240 08/14/19 1241 08/14/19 1333  BP:  (!) 135/95   Pulse:  67   Resp:  18   Temp:  98.2 F (36.8 C)   TempSrc:  Oral   SpO2:  96%   Weight:  180 lb (81.6 kg)   Height:  5\' 6"  (1.676 m)   PainSc: 8   8     Physical Exam: Physical Exam  Constitutional: He is oriented to person, place, and time and well-developed, well-nourished, and in no distress.  Appears much improved as compared to last clinic visit.   HENT:  Head: Normocephalic and atraumatic.  Eyes: Pupils are equal, round, and reactive to light.  Cardiovascular: Normal rate, regular rhythm, normal heart sounds and intact distal pulses.  Pulmonary/Chest: Effort normal. He has decreased breath sounds (throughout). He has rhonchi (scattered; clears with cough).  Moderate cough noted in clinic. No SOB or increased WOB. No distress. Able to speak in complete sentences without difficulties. SPO2 96% on RA.  Neurological: He is alert and oriented to person, place, and time. Gait normal.  Skin: Skin is warm and dry. No rash noted. He is not diaphoretic.  Psychiatric: Mood, memory, affect and judgment normal.  Nursing note and vitals reviewed.   Urgent Care Treatments / Results:   Orders Placed This Encounter  Procedures  . DG  Chest 2 View    LABS: PLEASE NOTE: all labs that were ordered this encounter are listed, however only abnormal results are displayed. Labs Reviewed - No data to display  EKG: -None  RADIOLOGY: DG Chest 2 View  Result Date: 08/14/2019 CLINICAL DATA:  Chronic pleuritic chest pain EXAM: CHEST - 2 VIEW COMPARISON:  07/28/2019 FINDINGS: Persistent patchy bilateral interstitial and alveolar airspace disease. No pleural effusion or pneumothorax. Stable cardiomediastinal silhouette. No aggressive osseous lesion. IMPRESSION: Persistent patchy bilateral interstitial and alveolar airspace disease concerning for pneumonia. Electronically Signed   By: Kathreen Devoid   On: 08/14/2019 13:19    PROCEDURES: Procedures  MEDICATIONS RECEIVED THIS VISIT: Medications - No data to display  PERTINENT CLINICAL COURSE NOTES/UPDATES:   Initial Impression / Assessment and Plan / Urgent Care Course:  Pertinent labs & imaging results that  were available during my care of the patient were personally reviewed by me and considered in my medical decision making (see lab/imaging section of note for values and interpretations).  Stephen Erickson is a 64 y.o. male who presents to [facility] today with complaints of Cough  Patient is well appearing overall in clinic today. He does not appear to be in any acute distress. Presenting symptoms (see HPI) and exam as documented above. Symptoms of SARS-CoV-2 related PNA persist despite treatment with steroids, flouroquinolone course, and anti-tussive medication. Clinically, he has improved overall. VS are more stable and his stamina has somewhat improved. Patient states, "I am tired of being give out and coughing so much". Cough reported to be worse at night. Will proceed as follows:   Repeat radiographs of the chest performed today revealed persistent patchy BILATERAL opacities consistent with his known pneumonia.   Will refill Tussionex and add Tessalon for daytime  use.    Discussed continued supportive care measures at home. Patient to make efforts to remain active in efforts to improve lung function allowing for him to return to his PLOF. He was encouraged to ensure adequate hydration (water and ORS) to prevent dehydration and electrolyte derangements. Patient may use APAP and/or IBU on an as needed basis for pain/fever.    At this point, patient is over a month out from his SARS-CoV-2 (diagnosed 07/12/2019). Concern that patient is going to be a long hauler with regards to his respiratory status. I feel that he would benefit from being seen by PCCM for further evaluation. I took the liberty of contacting [facility] in efforts to obtain him an expedited appointment. PCCM was able to oblige request and will see patient tomorrow (07/26/2019) at 1315 PM in [place] office. Patient made aware of appointment details and  He was encouraged to attend without fail.    I have reviewed the follow up and strict return precautions for any new or worsening symptoms. Patient is aware of symptoms that would be deemed urgent/emergent, and would thus require further evaluation either here or in the emergency department. At the time of discharge, he verbalized understanding and consent with the discharge plan as it was reviewed with him. All questions were fielded by provider and/or clinic staff prior to patient discharge.    Final Clinical Impressions / Urgent Care Diagnoses:   Final diagnoses:  Pneumonia due to COVID-19 virus  Cough    New Prescriptions:  [place] Controlled Substance Registry consulted? Yes, I have consulted the [place] Controlled Substances Registry for this patient, and feel the risk/benefit ratio today is favorable for proceeding with this prescription for a controlled substance.  . Discussed use of controlled substance medication to treat his acute symptoms.  o Reviewed [place] STOP Act regulations  o Clinic does not refill controlled substances over the  phone without face to face evaluation.  . Safety precautions reviewed.  o Medications should not be sold or taken with alcohol.  o Avoid use while working, driving, or operating heavy machinery.  o Side effects associated with the use of this particular medication reviewed. - Patient understands that this medication can cause CNS depression, increase his risk of falls, and even lead to overdose that may result in death, if used outside of the parameters that he and I discussed.  With all of this in mind, he knowingly accepts the risks and responsibilities associated with intended course of treatment, and elects to responsibly proceed as discussed.  Meds ordered this encounter  Medications  . chlorpheniramine-HYDROcodone (TUSSIONEX PENNKINETIC ER) 10-8 MG/5ML SUER    Sig: Take 5 mLs by mouth every 12 (twelve) hours as needed for cough. Will cause drowsiness; NO DRIVING.    Dispense:  70 mL    Refill:  0  . benzonatate (TESSALON) 100 MG capsule    Sig: Take 1 capsule (100 mg total) by mouth 3 (three) times daily as needed for cough.    Dispense:  21 capsule    Refill:  0    Recommended Follow up Care:  Patient encouraged to follow up with the following provider within the specified time frame, or sooner as dictated by the severity of his symptoms. As always, he was instructed that for any urgent/emergent care needs, he should seek care either here or in the emergency department for more immediate evaluation.  Follow-up Information    Schedule an appointment as soon as possible for a visit  with Erby Pian, MD.   Specialty: Specialist Contact information: [place] [address] [phone]         NOTE: This note was prepared using Dragon dictation software along with smaller phrase technology. Despite my best ability to proofread, there is the potential that transcriptional errors may still occur from this process, and are completely unintentional.      Karen Kitchens, NP 08/16/19 1126

## 2019-09-09 DIAGNOSIS — R0600 Dyspnea, unspecified: Secondary | ICD-10-CM | POA: Diagnosis not present

## 2019-09-13 DIAGNOSIS — I480 Paroxysmal atrial fibrillation: Secondary | ICD-10-CM | POA: Diagnosis not present

## 2019-09-13 DIAGNOSIS — Z7901 Long term (current) use of anticoagulants: Secondary | ICD-10-CM | POA: Diagnosis not present

## 2019-09-13 DIAGNOSIS — E782 Mixed hyperlipidemia: Secondary | ICD-10-CM | POA: Diagnosis not present

## 2019-09-13 DIAGNOSIS — I2510 Atherosclerotic heart disease of native coronary artery without angina pectoris: Secondary | ICD-10-CM | POA: Diagnosis not present

## 2019-09-18 DIAGNOSIS — J9601 Acute respiratory failure with hypoxia: Secondary | ICD-10-CM | POA: Diagnosis not present

## 2019-09-18 DIAGNOSIS — U071 COVID-19: Secondary | ICD-10-CM | POA: Diagnosis not present

## 2019-09-18 DIAGNOSIS — J9621 Acute and chronic respiratory failure with hypoxia: Secondary | ICD-10-CM | POA: Diagnosis not present

## 2019-09-20 DIAGNOSIS — Z79899 Other long term (current) drug therapy: Secondary | ICD-10-CM | POA: Diagnosis not present

## 2019-09-20 DIAGNOSIS — Z0000 Encounter for general adult medical examination without abnormal findings: Secondary | ICD-10-CM | POA: Diagnosis not present

## 2019-09-20 DIAGNOSIS — Z7901 Long term (current) use of anticoagulants: Secondary | ICD-10-CM | POA: Diagnosis not present

## 2019-09-20 DIAGNOSIS — E669 Obesity, unspecified: Secondary | ICD-10-CM | POA: Diagnosis not present

## 2019-09-20 DIAGNOSIS — I2510 Atherosclerotic heart disease of native coronary artery without angina pectoris: Secondary | ICD-10-CM | POA: Diagnosis not present

## 2019-09-20 DIAGNOSIS — E782 Mixed hyperlipidemia: Secondary | ICD-10-CM | POA: Diagnosis not present

## 2019-09-20 DIAGNOSIS — I495 Sick sinus syndrome: Secondary | ICD-10-CM | POA: Diagnosis not present

## 2020-02-18 DIAGNOSIS — Z5181 Encounter for therapeutic drug level monitoring: Secondary | ICD-10-CM | POA: Diagnosis not present

## 2020-02-18 DIAGNOSIS — I10 Essential (primary) hypertension: Secondary | ICD-10-CM | POA: Diagnosis not present

## 2020-02-18 DIAGNOSIS — Z79899 Other long term (current) drug therapy: Secondary | ICD-10-CM | POA: Diagnosis not present

## 2020-02-18 DIAGNOSIS — I480 Paroxysmal atrial fibrillation: Secondary | ICD-10-CM | POA: Diagnosis not present

## 2020-02-18 DIAGNOSIS — Z23 Encounter for immunization: Secondary | ICD-10-CM | POA: Diagnosis not present

## 2020-02-18 DIAGNOSIS — I2510 Atherosclerotic heart disease of native coronary artery without angina pectoris: Secondary | ICD-10-CM | POA: Diagnosis not present

## 2020-02-18 DIAGNOSIS — E782 Mixed hyperlipidemia: Secondary | ICD-10-CM | POA: Diagnosis not present

## 2020-03-19 DIAGNOSIS — Z01818 Encounter for other preprocedural examination: Secondary | ICD-10-CM | POA: Diagnosis not present

## 2020-03-19 DIAGNOSIS — Z8616 Personal history of COVID-19: Secondary | ICD-10-CM | POA: Diagnosis not present

## 2020-03-26 DIAGNOSIS — I495 Sick sinus syndrome: Secondary | ICD-10-CM | POA: Diagnosis not present

## 2020-03-26 DIAGNOSIS — E669 Obesity, unspecified: Secondary | ICD-10-CM | POA: Diagnosis not present

## 2020-03-26 DIAGNOSIS — Z79899 Other long term (current) drug therapy: Secondary | ICD-10-CM | POA: Diagnosis not present

## 2020-03-26 DIAGNOSIS — E782 Mixed hyperlipidemia: Secondary | ICD-10-CM | POA: Diagnosis not present

## 2020-03-26 DIAGNOSIS — I480 Paroxysmal atrial fibrillation: Secondary | ICD-10-CM | POA: Diagnosis not present

## 2020-03-26 DIAGNOSIS — I2510 Atherosclerotic heart disease of native coronary artery without angina pectoris: Secondary | ICD-10-CM | POA: Diagnosis not present

## 2020-03-26 DIAGNOSIS — I10 Essential (primary) hypertension: Secondary | ICD-10-CM | POA: Diagnosis not present

## 2020-03-26 DIAGNOSIS — Z7901 Long term (current) use of anticoagulants: Secondary | ICD-10-CM | POA: Diagnosis not present

## 2020-08-11 DIAGNOSIS — Z79899 Other long term (current) drug therapy: Secondary | ICD-10-CM | POA: Diagnosis not present

## 2020-08-11 DIAGNOSIS — Z6833 Body mass index (BMI) 33.0-33.9, adult: Secondary | ICD-10-CM | POA: Diagnosis not present

## 2020-08-11 DIAGNOSIS — Z5181 Encounter for therapeutic drug level monitoring: Secondary | ICD-10-CM | POA: Diagnosis not present

## 2020-08-11 DIAGNOSIS — E6609 Other obesity due to excess calories: Secondary | ICD-10-CM | POA: Diagnosis not present

## 2020-08-11 DIAGNOSIS — E782 Mixed hyperlipidemia: Secondary | ICD-10-CM | POA: Diagnosis not present

## 2020-08-11 DIAGNOSIS — I471 Supraventricular tachycardia: Secondary | ICD-10-CM | POA: Diagnosis not present

## 2020-08-11 DIAGNOSIS — I480 Paroxysmal atrial fibrillation: Secondary | ICD-10-CM | POA: Diagnosis not present

## 2020-08-11 DIAGNOSIS — I2510 Atherosclerotic heart disease of native coronary artery without angina pectoris: Secondary | ICD-10-CM | POA: Diagnosis not present

## 2020-08-11 DIAGNOSIS — I10 Essential (primary) hypertension: Secondary | ICD-10-CM | POA: Diagnosis not present

## 2020-09-28 DIAGNOSIS — I480 Paroxysmal atrial fibrillation: Secondary | ICD-10-CM | POA: Diagnosis not present

## 2020-09-28 DIAGNOSIS — Z125 Encounter for screening for malignant neoplasm of prostate: Secondary | ICD-10-CM | POA: Diagnosis not present

## 2020-09-28 DIAGNOSIS — I2510 Atherosclerotic heart disease of native coronary artery without angina pectoris: Secondary | ICD-10-CM | POA: Diagnosis not present

## 2020-09-28 DIAGNOSIS — Z7901 Long term (current) use of anticoagulants: Secondary | ICD-10-CM | POA: Diagnosis not present

## 2020-09-28 DIAGNOSIS — E782 Mixed hyperlipidemia: Secondary | ICD-10-CM | POA: Diagnosis not present

## 2020-09-28 DIAGNOSIS — Z1211 Encounter for screening for malignant neoplasm of colon: Secondary | ICD-10-CM | POA: Diagnosis not present

## 2020-09-28 DIAGNOSIS — Z0000 Encounter for general adult medical examination without abnormal findings: Secondary | ICD-10-CM | POA: Diagnosis not present

## 2020-09-28 DIAGNOSIS — I495 Sick sinus syndrome: Secondary | ICD-10-CM | POA: Diagnosis not present

## 2020-09-28 DIAGNOSIS — R195 Other fecal abnormalities: Secondary | ICD-10-CM | POA: Diagnosis not present

## 2020-09-28 DIAGNOSIS — I10 Essential (primary) hypertension: Secondary | ICD-10-CM | POA: Diagnosis not present

## 2020-09-28 DIAGNOSIS — E669 Obesity, unspecified: Secondary | ICD-10-CM | POA: Diagnosis not present

## 2020-09-28 DIAGNOSIS — Z79899 Other long term (current) drug therapy: Secondary | ICD-10-CM | POA: Diagnosis not present

## 2020-10-13 DIAGNOSIS — Z1211 Encounter for screening for malignant neoplasm of colon: Secondary | ICD-10-CM | POA: Diagnosis not present

## 2020-10-21 LAB — EXTERNAL GENERIC LAB PROCEDURE: COLOGUARD: POSITIVE — AB

## 2021-01-04 DIAGNOSIS — R195 Other fecal abnormalities: Secondary | ICD-10-CM | POA: Diagnosis not present

## 2021-01-14 ENCOUNTER — Encounter
Admission: RE | Disposition: A | Discharge status: Home / Self Care | Payer: Self-pay | Source: Home / Self Care | Attending: Gastroenterology

## 2021-01-14 ENCOUNTER — Ambulatory Visit
Admission: RE | Admit: 2021-01-14 | Discharge: 2021-01-14 | Disposition: A | Discharge status: Home / Self Care | Payer: PPO | Attending: Gastroenterology | Admitting: Gastroenterology

## 2021-01-14 ENCOUNTER — Ambulatory Visit: Payer: PPO | Admitting: Anesthesiology

## 2021-01-14 ENCOUNTER — Encounter: Payer: Self-pay | Admitting: Anesthesiology

## 2021-01-14 DIAGNOSIS — I2510 Atherosclerotic heart disease of native coronary artery without angina pectoris: Secondary | ICD-10-CM | POA: Diagnosis not present

## 2021-01-14 DIAGNOSIS — Z7982 Long term (current) use of aspirin: Secondary | ICD-10-CM | POA: Insufficient documentation

## 2021-01-14 DIAGNOSIS — Z96653 Presence of artificial knee joint, bilateral: Secondary | ICD-10-CM | POA: Diagnosis not present

## 2021-01-14 DIAGNOSIS — Q438 Other specified congenital malformations of intestine: Secondary | ICD-10-CM | POA: Insufficient documentation

## 2021-01-14 DIAGNOSIS — K640 First degree hemorrhoids: Secondary | ICD-10-CM | POA: Insufficient documentation

## 2021-01-14 DIAGNOSIS — Z8616 Personal history of COVID-19: Secondary | ICD-10-CM | POA: Diagnosis not present

## 2021-01-14 DIAGNOSIS — I10 Essential (primary) hypertension: Secondary | ICD-10-CM | POA: Diagnosis not present

## 2021-01-14 DIAGNOSIS — R195 Other fecal abnormalities: Secondary | ICD-10-CM | POA: Diagnosis not present

## 2021-01-14 DIAGNOSIS — D126 Benign neoplasm of colon, unspecified: Secondary | ICD-10-CM | POA: Diagnosis not present

## 2021-01-14 DIAGNOSIS — E785 Hyperlipidemia, unspecified: Secondary | ICD-10-CM | POA: Insufficient documentation

## 2021-01-14 DIAGNOSIS — I252 Old myocardial infarction: Secondary | ICD-10-CM | POA: Diagnosis not present

## 2021-01-14 DIAGNOSIS — Z7901 Long term (current) use of anticoagulants: Secondary | ICD-10-CM | POA: Insufficient documentation

## 2021-01-14 DIAGNOSIS — Z888 Allergy status to other drugs, medicaments and biological substances status: Secondary | ICD-10-CM | POA: Insufficient documentation

## 2021-01-14 DIAGNOSIS — Z7902 Long term (current) use of antithrombotics/antiplatelets: Secondary | ICD-10-CM | POA: Diagnosis not present

## 2021-01-14 DIAGNOSIS — Z79899 Other long term (current) drug therapy: Secondary | ICD-10-CM | POA: Diagnosis not present

## 2021-01-14 DIAGNOSIS — K635 Polyp of colon: Secondary | ICD-10-CM | POA: Insufficient documentation

## 2021-01-14 DIAGNOSIS — K649 Unspecified hemorrhoids: Secondary | ICD-10-CM | POA: Diagnosis not present

## 2021-01-14 DIAGNOSIS — I4891 Unspecified atrial fibrillation: Secondary | ICD-10-CM | POA: Insufficient documentation

## 2021-01-14 DIAGNOSIS — Z1211 Encounter for screening for malignant neoplasm of colon: Secondary | ICD-10-CM | POA: Diagnosis not present

## 2021-01-14 HISTORY — PX: COLONOSCOPY WITH PROPOFOL: SHX5780

## 2021-01-14 SURGERY — COLONOSCOPY WITH PROPOFOL
Anesthesia: General

## 2021-01-14 MED ORDER — PROPOFOL 500 MG/50ML IV EMUL
INTRAVENOUS | Status: DC | PRN
  Administered 2021-01-14: 120 ug/kg/min via INTRAVENOUS

## 2021-01-14 MED ORDER — PROPOFOL 500 MG/50ML IV EMUL
INTRAVENOUS | Status: AC
  Filled 2021-01-14: qty 50

## 2021-01-14 MED ORDER — PROPOFOL 10 MG/ML IV BOLUS
INTRAVENOUS | Status: AC
  Filled 2021-01-14: qty 20

## 2021-01-14 MED ORDER — SODIUM CHLORIDE 0.9 % IV SOLN: INTRAVENOUS | Status: DC

## 2021-01-14 NOTE — H&P (Signed)
[facility] Pre-Procedure H&P   Patient ID: Stephen Erickson is a 65 y.o. male.  Gastroenterology Provider: Annamaria Helling, DO  Referring Provider: Stephens November, NP PCP: [facility]  Date: 01/14/2021  HPI Mr. Stephen Erickson is a 65 y.o. male who presents today for Colonoscopy for positive cologuard (colorectal cancer screening) and bright red blood per rectum. Plavix held since Monday. On baby asa. No oac  No abdominal pain, weight loss, melena, night sweats, fever, chills.  Past Medical History:  Diagnosis Date   Angina pectoris ([place])    Arthritis    Atrial fibrillation ([place])    Coronary artery disease    Dysrhythmia    History of 2019 novel coronavirus disease (COVID-19) 07/12/2019   Diagnosed at [facility]   Hyperlipidemia    Hypertension    Myocardial infarction ([place]) 2011   PAC (premature atrial contraction)    Pneumonia    Tachy-brady syndrome ([facility])    Vertebral artery occlusion, left     Past Surgical History:  Procedure Laterality Date   ANGIOPLASTY     CARDIAC CATHETERIZATION     CORONARY ANGIOPLASTY     HERNIA REPAIR     KNEE ARTHROSCOPY Left 1979   TOTAL KNEE ARTHROPLASTY Left 10/31/2017   Procedure: TOTAL KNEE ARTHROPLASTY;  Surgeon: Corky Mull, MD;  Location: [facility] ORS;  Service: Orthopedics;  Laterality: Left;   TOTAL KNEE ARTHROPLASTY Right 03/06/2018   Procedure: TOTAL KNEE ARTHROPLASTY;  Surgeon: Corky Mull, MD;  Location: [facility] ORS;  Service: Orthopedics;  Laterality: Right;    Family History Paterna grandfather, aunt, and uncle all with CRC No other h/o GI disease or malignancy  Review of Systems  Constitutional:  Negative for activity change, appetite change, chills, fatigue, fever and unexpected weight change.  HENT:  Negative for trouble swallowing and voice change.   Respiratory:  Negative for shortness of breath.   Cardiovascular:  Negative for chest pain and palpitations.  Gastrointestinal:   Positive for blood in stool. Negative for abdominal distention, abdominal pain, anal bleeding, constipation, diarrhea, nausea and vomiting.  Musculoskeletal:  Negative for arthralgias and myalgias.  Skin:  Negative for color change and pallor.  Neurological:  Negative for dizziness, syncope and weakness.  Psychiatric/Behavioral:  Negative for confusion. The patient is not nervous/anxious.   All other systems reviewed and are negative.   Medications No current facility-administered medications on file prior to encounter.   Current Outpatient Medications on File Prior to Encounter  Medication Sig Dispense Refill   aspirin EC 81 MG tablet Take 81 mg by mouth daily. Swallow whole.     clopidogrel (PLAVIX) 75 MG tablet Take 75 mg by mouth daily.     diltiazem (CARDIZEM LA) 120 MG 24 hr tablet Take 120 mg by mouth daily.     metoprolol succinate (TOPROL-XL) 25 MG 24 hr tablet Take 25 mg by mouth daily.  1   sotalol (BETAPACE) 80 MG tablet Take 80 mg by mouth 2 (two) times daily.     apixaban (ELIQUIS) 5 MG TABS tablet Take 5 mg by mouth 2 (two) times daily. (Patient not taking: Reported on 01/14/2021)     benzonatate (TESSALON) 100 MG capsule Take 1 capsule (100 mg total) by mouth 3 (three) times daily as needed for cough. 21 capsule 0   chlorpheniramine-HYDROcodone (TUSSIONEX PENNKINETIC ER) 10-8 MG/5ML SUER Take 5 mLs by mouth every 12 (twelve) hours as needed for cough. Will cause drowsiness; NO DRIVING. 70 mL 0  cyclobenzaprine (FLEXERIL) 5 MG tablet Take 1 tablet (5 mg total) by mouth 3 (three) times daily as needed for muscle spasms. 30 tablet 1   nitroGLYCERIN (NITROSTAT) 0.4 MG SL tablet SMARTSIG:1 Tablet(s) Sublingual As Needed     rosuvastatin (CRESTOR) 40 MG tablet Take 1 tablet by mouth daily.     traMADol (ULTRAM) 50 MG tablet Take 1 tablet (50 mg total) by mouth every 6 (six) hours as needed for moderate pain. 40 tablet 0   [DISCONTINUED] atorvastatin (LIPITOR) 80 MG tablet Take 80 mg  by mouth at bedtime.       Pertinent medications related to GI and procedure were reviewed by me with the patient prior to the procedure   Current Facility-Administered Medications:    0.9 %  sodium chloride infusion, , Intravenous, Continuous, Annamaria Helling, DO, Last Rate: 20 mL/hr at 01/14/21 1106, New Bag at 01/14/21 1106  sodium chloride 20 mL/hr at 01/14/21 1106       Allergies  Allergen Reactions   Atorvastatin Diarrhea   Lisinopril Cough   Allergies were reviewed by me prior to the procedure  Objective    Vitals:   01/14/21 1050  BP: (!) 144/82  Pulse: (!) 57  Resp: 18  Temp: (!) 97 F (36.1 C)  TempSrc: Temporal  SpO2: 98%  Weight: 93.4 kg  Height: '5\' 6"'$  (1.676 m)     Physical Exam Vitals and nursing note reviewed.  Constitutional:      General: He is not in acute distress.    Appearance: Normal appearance. He is not toxic-appearing or diaphoretic.     Comments: overweight  HENT:     Head: Normocephalic and atraumatic.     Nose: Nose normal.     Mouth/Throat:     Mouth: Mucous membranes are moist.     Pharynx: Oropharynx is clear.  Eyes:     General: No scleral icterus.    Extraocular Movements: Extraocular movements intact.  Cardiovascular:     Rate and Rhythm: Normal rate and regular rhythm.     Heart sounds: Normal heart sounds. No murmur heard.   No friction rub. No gallop.  Pulmonary:     Effort: Pulmonary effort is normal. No respiratory distress.     Breath sounds: Normal breath sounds. No wheezing, rhonchi or rales.  Abdominal:     General: Bowel sounds are normal. There is no distension.     Palpations: Abdomen is soft.     Tenderness: There is no abdominal tenderness. There is no guarding or rebound.     Comments: protuberant  Musculoskeletal:     Cervical back: Neck supple.     Right lower leg: No edema.     Left lower leg: No edema.  Skin:    General: Skin is warm and dry.     Coloration: Skin is not jaundiced or pale.   Neurological:     General: No focal deficit present.     Mental Status: He is alert and oriented to person, place, and time. Mental status is at baseline.  Psychiatric:        Mood and Affect: Mood normal.        Behavior: Behavior normal.        Thought Content: Thought content normal.        Judgment: Judgment normal.     Assessment:  Mr. ORAS SPLITT is a 65 y.o. male  who presents today for Colonoscopy for positive cologuard (colorectal cancer screening) and bright  red blood per rectum.  Plan:  Colonoscopy with possible intervention today  Colonoscopy with possible biopsy, control of bleeding, polypectomy, and interventions as necessary has been discussed with the patient/patient representative. Informed consent was obtained from the patient/patient representative after explaining the indication, nature, and risks of the procedure including but not limited to death, bleeding, perforation, missed neoplasm/lesions, cardiorespiratory compromise, and reaction to medications. Opportunity for questions was given and appropriate answers were provided. Patient/patient representative has verbalized understanding is amenable to undergoing the procedure.   Annamaria Helling, DO  [facility]  Portions of the record may have been created with voice recognition software. Occasional wrong-word or 'sound-a-like' substitutions may have occurred due to the inherent limitations of voice recognition software.  Read the chart carefully and recognize, using context, where substitutions may have occurred.

## 2021-01-14 NOTE — Transfer of Care (Signed)
Immediate Anesthesia Transfer of Care Note  Patient: Stephen Erickson  Procedure(s) Performed: COLONOSCOPY WITH PROPOFOL  Patient Location: PACU  Anesthesia Type:General  Level of Consciousness: awake and sedated  Airway & Oxygen Therapy: Patient Spontanous Breathing and Patient connected to nasal cannula oxygen  Post-op Assessment: Report given to RN and Post -op Vital signs reviewed and stable  Post vital signs: Reviewed and stable  Last Vitals:  Vitals Value Taken Time  BP 92/61 01/14/21 1314  Temp 36 C 01/14/21 1314  Pulse 66 01/14/21 1314  Resp 8 01/14/21 1314  SpO2 93 % 01/14/21 1314    Last Pain:  Vitals:   01/14/21 1314  TempSrc: Temporal  PainSc: Asleep         Complications: No notable events documented.

## 2021-01-14 NOTE — Anesthesia Procedure Notes (Signed)
Date/Time: 01/14/2021 12:11 PM Performed by: Vaughan Sine Pre-anesthesia Checklist: Patient identified, Emergency Drugs available, Suction available, Patient being monitored and Timeout performed Patient Re-evaluated:Patient Re-evaluated prior to induction Oxygen Delivery Method: Nasal cannula Preoxygenation: Pre-oxygenation with 100% oxygen Induction Type: IV induction Placement Confirmation: CO2 detector and positive ETCO2

## 2021-01-14 NOTE — Anesthesia Preprocedure Evaluation (Signed)
Anesthesia Evaluation  Patient identified by MRN, date of birth, ID band Patient awake    Reviewed: Allergy & Precautions, NPO status , Patient's Chart, lab work & pertinent test results  History of Anesthesia Complications Negative for: history of anesthetic complications  Airway Mallampati: III  TM Distance: >3 FB Neck ROM: Full    Dental  (+) Poor Dentition, Missing, Dental Advidsory Given   Pulmonary neg pulmonary ROS, neg shortness of breath, neg sleep apnea, neg COPD, neg recent URI,    breath sounds clear to auscultation- rhonchi (-) wheezing      Cardiovascular Exercise Tolerance: Good hypertension, Pt. on medications (-) angina+ CAD, + Past MI and + Cardiac Stents  + dysrhythmias Atrial Fibrillation  Rhythm:Regular Rate:Normal - Systolic murmurs and - Diastolic murmurs    Neuro/Psych negative neurological ROS  negative psych ROS   GI/Hepatic negative GI ROS, Neg liver ROS,   Endo/Other  negative endocrine ROSneg diabetes  Renal/GU negative Renal ROS     Musculoskeletal  (+) Arthritis ,   Abdominal (+) + obese,   Peds  Hematology negative hematology ROS (+)   Anesthesia Other Findings Past Medical History: No date: Angina pectoris ([facility]) No date: Arthritis No date: Atrial fibrillation ([facility]) No date: Coronary artery disease No date: Dysrhythmia No date: Hyperlipidemia No date: Hypertension 2011: Myocardial infarction ([place]) No date: PAC (premature atrial contraction) No date: Pneumonia No date: Tachy-brady syndrome ([facility]) No date: Vertebral artery occlusion, left   Reproductive/Obstetrics                             Lab Results  Component Value Date   WBC 8.2 03/09/2018   HGB 12.3 (L) 03/09/2018   HCT 36.2 (L) 03/09/2018   MCV 88.9 03/09/2018   PLT 204 03/09/2018    Anesthesia Physical  Anesthesia Plan  ASA: 3  Anesthesia Plan: General   Post-op Pain Management:     Induction: Intravenous  PONV Risk Score and Plan: 1 and Propofol infusion and TIVA  Airway Management Planned: Natural Airway and Nasal Cannula  Additional Equipment:   Intra-op Plan:   Post-operative Plan:   Informed Consent: I have reviewed the patients History and Physical, chart, labs and discussed the procedure including the risks, benefits and alternatives for the proposed anesthesia with the patient or authorized representative who has indicated his/her understanding and acceptance.     Dental advisory given  Plan Discussed with: CRNA and Anesthesiologist  Anesthesia Plan Comments:         Anesthesia Quick Evaluation

## 2021-01-14 NOTE — Op Note (Signed)
[facility]enterology Patient Name: Stephen Erickson Procedure Date: 01/14/2021 11:51 AM MRN: [id number] Account #: [id number] Date of Birth: 1955/12/11 Admit Type: Outpatient Age: 65 Room: [facility] ENDO ROOM 1 Gender: Male Note Status: Finalized Procedure:             Colonoscopy Indications:           Positive Cologuard test Providers:             Rueben Bash, DO Referring MD:          [facility] (Referring MD) Medicines:             Monitored Anesthesia Care Complications:         No immediate complications. Estimated blood loss:                         Minimal. Procedure:             Pre-Anesthesia Assessment:                        - Prior to the procedure, a History and Physical was                         performed, and patient medications and allergies were                         reviewed. The patient is competent. The risks and                         benefits of the procedure and the sedation options and                         risks were discussed with the patient. All questions                         were answered and informed consent was obtained.                         Patient identification and proposed procedure were                         verified by the physician, the nurse, the anesthetist                         and the technician in the endoscopy suite. Mental                         Status Examination: alert and oriented. Airway                         Examination: normal oropharyngeal airway and neck                         mobility. Respiratory Examination: clear to                         auscultation. CV Examination: RRR, no murmurs, no S3  or S4. Prophylactic Antibiotics: The patient does not                         require prophylactic antibiotics. Prior                         Anticoagulants: The patient has taken no previous                         anticoagulant or antiplatelet  agents. ASA Grade                         Assessment: III - A patient with severe systemic                         disease. After reviewing the risks and benefits, the                         patient was deemed in satisfactory condition to                         undergo the procedure. The anesthesia plan was to use                         monitored anesthesia care (MAC). Immediately prior to                         administration of medications, the patient was                         re-assessed for adequacy to receive sedatives. The                         heart rate, respiratory rate, oxygen saturations,                         blood pressure, adequacy of pulmonary ventilation, and                         response to care were monitored throughout the                         procedure. The physical status of the patient was                         re-assessed after the procedure.                        After obtaining informed consent, the colonoscope was                         passed under direct vision. Throughout the procedure,                         the patient's blood pressure, pulse, and oxygen                         saturations were monitored continuously. The  Colonoscope was introduced through the anus and                         advanced to the the cecum, identified by appendiceal                         orifice and ileocecal valve. The colonoscopy was                         technically difficult and complex due to a redundant                         colon, significant looping and the patient's body                         habitus. Successful completion of the procedure was                         aided by changing the patient to a supine position,                         changing the patient to a prone position, using manual                         pressure, straightening and shortening the scope to                         obtain bowel loop  reduction, using scope torsion and                         applying abdominal pressure. The patient tolerated the                         procedure well. The quality of the bowel preparation                         was evaluated using the BBPS [facility] Bowel Preparation                         Scale) with scores of: Right Colon = 2 (minor amount                         of residual staining, small fragments of stool and/or                         opaque liquid, but mucosa seen well), Transverse Colon                         = 3 (entire mucosa seen well with no residual                         staining, small fragments of stool or opaque liquid)                         and Left Colon = 3 (entire mucosa seen well with no  residual staining, small fragments of stool or opaque                         liquid). The total BBPS score equals 8. The quality of                         the bowel preparation was excellent. Findings:      The perianal and digital rectal examinations were normal. Pertinent       negatives include normal sphincter tone.      Three sessile polyps were found in the sigmoid colon (x2) and cecum       (x1). The polyps were 2 to 3 mm in size. These polyps were removed with       a cold biopsy forceps. Resection and retrieval were complete. Estimated       blood loss was minimal.      Non-bleeding internal hemorrhoids were found during retroflexion. The       hemorrhoids were Grade I (internal hemorrhoids that do not prolapse).       Estimated blood loss: none.      The exam was otherwise without abnormality on direct and retroflexion       views. Impression:            - Three 2 to 3 mm polyps in the sigmoid colon and in                         the cecum, removed with a cold biopsy forceps.                         Resected and retrieved.                        - Non-bleeding internal hemorrhoids.                        - The examination was otherwise  normal on direct and                         retroflexion views. Recommendation:        - Discharge patient to home.                        - Resume previous diet.                        - Continue present medications.                        - Await pathology results.                        - Repeat colonoscopy for surveillance based on                         pathology results.                        - Return to referring physician as previously                         scheduled.                        -  Resume Plavix (clopidogrel) at prior dose tomorrow.                         Refer to primary physician for further adjustment of                         therapy. Procedure Code(s):     --- Professional ---                        [phone], Colonoscopy, flexible; with biopsy, single or                         multiple Diagnosis Code(s):     --- Professional ---                        K63.5, Polyp of colon                        K64.0, First degree hemorrhoids                        R19.5, Other fecal abnormalities CPT copyright 2019 [employer]. All rights reserved. The codes documented in this report are preliminary and upon coder review may  be revised to meet current compliance requirements. Attending Participation:      I personally performed the entire procedure. Volney American, DO Annamaria Helling DO, DO 01/14/2021 1:17:08 PM This report has been signed electronically. Number of Addenda: 0 Note Initiated On: 01/14/2021 11:51 AM Scope Withdrawal Time: 0 hours 17 minutes 48 seconds  Total Procedure Duration: 0 hours 59 minutes 8 seconds  Estimated Blood Loss:  Estimated blood loss was minimal.      [facility]

## 2021-01-14 NOTE — Interval H&P Note (Signed)
History and Physical Interval Note: Preprocedure H&P from 01/14/21  was reviewed and there was no interval change after seeing and examining the patient.  Written consent was obtained from the patient after discussion of risks, benefits, and alternatives. Patient has consented to proceed with Colonoscopy with possible intervention   01/14/2021 12:04 PM  Stephen Erickson  has presented today for surgery, with the diagnosis of positive coloerectal cancer screening using cologuard test.  The various methods of treatment have been discussed with the patient and family. After consideration of risks, benefits and other options for treatment, the patient has consented to  Procedure(s) with comments: COLONOSCOPY WITH PROPOFOL (N/A) - PLAVIX & XARELTO as a surgical intervention.  The patient's history has been reviewed, patient examined, no change in status, stable for surgery.  I have reviewed the patient's chart and labs.  Questions were answered to the patient's satisfaction.     Annamaria Helling

## 2021-01-14 NOTE — Anesthesia Postprocedure Evaluation (Signed)
Anesthesia Post Note  Patient: Stephen Erickson  Procedure(s) Performed: COLONOSCOPY WITH PROPOFOL  Patient location during evaluation: Endoscopy Anesthesia Type: General Level of consciousness: awake and alert Pain management: pain level controlled Vital Signs Assessment: post-procedure vital signs reviewed and stable Respiratory status: spontaneous breathing, nonlabored ventilation, respiratory function stable and patient connected to nasal cannula oxygen Cardiovascular status: blood pressure returned to baseline and stable Postop Assessment: no apparent nausea or vomiting Anesthetic complications: no   No notable events documented.   Last Vitals:  Vitals:   01/14/21 1334 01/14/21 1344  BP: 113/81 (!) 118/92  Pulse: (!) 59 (!) 59  Resp: 18 (!) 21  Temp:    SpO2: 100% 100%    Last Pain:  Vitals:   01/14/21 1344  TempSrc:   PainSc: 0-No pain                 Martha Clan

## 2021-01-15 ENCOUNTER — Encounter: Payer: Self-pay | Admitting: Gastroenterology

## 2021-01-15 LAB — SURGICAL PATHOLOGY

## 2021-03-31 DIAGNOSIS — I10 Essential (primary) hypertension: Secondary | ICD-10-CM | POA: Diagnosis not present

## 2021-03-31 DIAGNOSIS — I480 Paroxysmal atrial fibrillation: Secondary | ICD-10-CM | POA: Diagnosis not present

## 2021-03-31 DIAGNOSIS — Z7901 Long term (current) use of anticoagulants: Secondary | ICD-10-CM | POA: Diagnosis not present

## 2021-03-31 DIAGNOSIS — E782 Mixed hyperlipidemia: Secondary | ICD-10-CM | POA: Diagnosis not present

## 2021-03-31 DIAGNOSIS — I495 Sick sinus syndrome: Secondary | ICD-10-CM | POA: Diagnosis not present

## 2021-03-31 DIAGNOSIS — Z79899 Other long term (current) drug therapy: Secondary | ICD-10-CM | POA: Diagnosis not present

## 2021-03-31 DIAGNOSIS — I2510 Atherosclerotic heart disease of native coronary artery without angina pectoris: Secondary | ICD-10-CM | POA: Diagnosis not present

## 2021-03-31 DIAGNOSIS — Z23 Encounter for immunization: Secondary | ICD-10-CM | POA: Diagnosis not present

## 2021-03-31 DIAGNOSIS — E669 Obesity, unspecified: Secondary | ICD-10-CM | POA: Diagnosis not present

## 2021-06-16 DIAGNOSIS — E782 Mixed hyperlipidemia: Secondary | ICD-10-CM | POA: Diagnosis not present

## 2021-06-16 DIAGNOSIS — I10 Essential (primary) hypertension: Secondary | ICD-10-CM | POA: Diagnosis not present

## 2021-06-16 DIAGNOSIS — E6609 Other obesity due to excess calories: Secondary | ICD-10-CM | POA: Diagnosis not present

## 2021-06-16 DIAGNOSIS — Z6833 Body mass index (BMI) 33.0-33.9, adult: Secondary | ICD-10-CM | POA: Diagnosis not present

## 2021-06-16 DIAGNOSIS — Z5181 Encounter for therapeutic drug level monitoring: Secondary | ICD-10-CM | POA: Diagnosis not present

## 2021-06-16 DIAGNOSIS — I2510 Atherosclerotic heart disease of native coronary artery without angina pectoris: Secondary | ICD-10-CM | POA: Diagnosis not present

## 2021-06-16 DIAGNOSIS — I480 Paroxysmal atrial fibrillation: Secondary | ICD-10-CM | POA: Diagnosis not present

## 2021-06-16 DIAGNOSIS — Z79899 Other long term (current) drug therapy: Secondary | ICD-10-CM | POA: Diagnosis not present

## 2021-09-09 DIAGNOSIS — M25511 Pain in right shoulder: Secondary | ICD-10-CM | POA: Diagnosis not present

## 2021-09-28 DIAGNOSIS — I480 Paroxysmal atrial fibrillation: Secondary | ICD-10-CM | POA: Diagnosis not present

## 2021-09-28 DIAGNOSIS — Z7901 Long term (current) use of anticoagulants: Secondary | ICD-10-CM | POA: Diagnosis not present

## 2021-09-28 DIAGNOSIS — E782 Mixed hyperlipidemia: Secondary | ICD-10-CM | POA: Diagnosis not present

## 2021-09-28 DIAGNOSIS — Z0000 Encounter for general adult medical examination without abnormal findings: Secondary | ICD-10-CM | POA: Diagnosis not present

## 2021-09-28 DIAGNOSIS — Z125 Encounter for screening for malignant neoplasm of prostate: Secondary | ICD-10-CM | POA: Diagnosis not present

## 2021-09-28 DIAGNOSIS — I495 Sick sinus syndrome: Secondary | ICD-10-CM | POA: Diagnosis not present

## 2021-09-28 DIAGNOSIS — I10 Essential (primary) hypertension: Secondary | ICD-10-CM | POA: Diagnosis not present

## 2021-09-28 DIAGNOSIS — Z79899 Other long term (current) drug therapy: Secondary | ICD-10-CM | POA: Diagnosis not present

## 2021-09-28 DIAGNOSIS — I2510 Atherosclerotic heart disease of native coronary artery without angina pectoris: Secondary | ICD-10-CM | POA: Diagnosis not present

## 2021-09-28 DIAGNOSIS — E669 Obesity, unspecified: Secondary | ICD-10-CM | POA: Diagnosis not present

## 2021-12-24 DIAGNOSIS — I2510 Atherosclerotic heart disease of native coronary artery without angina pectoris: Secondary | ICD-10-CM | POA: Diagnosis not present

## 2021-12-24 DIAGNOSIS — E782 Mixed hyperlipidemia: Secondary | ICD-10-CM | POA: Diagnosis not present

## 2021-12-24 DIAGNOSIS — I10 Essential (primary) hypertension: Secondary | ICD-10-CM | POA: Diagnosis not present

## 2021-12-24 DIAGNOSIS — Z79899 Other long term (current) drug therapy: Secondary | ICD-10-CM | POA: Diagnosis not present

## 2021-12-24 DIAGNOSIS — I480 Paroxysmal atrial fibrillation: Secondary | ICD-10-CM | POA: Diagnosis not present

## 2021-12-24 DIAGNOSIS — I471 Supraventricular tachycardia: Secondary | ICD-10-CM | POA: Diagnosis not present

## 2021-12-24 DIAGNOSIS — Z5181 Encounter for therapeutic drug level monitoring: Secondary | ICD-10-CM | POA: Diagnosis not present

## 2022-04-01 DIAGNOSIS — I480 Paroxysmal atrial fibrillation: Secondary | ICD-10-CM | POA: Diagnosis not present

## 2022-04-01 DIAGNOSIS — E782 Mixed hyperlipidemia: Secondary | ICD-10-CM | POA: Diagnosis not present

## 2022-04-01 DIAGNOSIS — I2510 Atherosclerotic heart disease of native coronary artery without angina pectoris: Secondary | ICD-10-CM | POA: Diagnosis not present

## 2022-04-01 DIAGNOSIS — E669 Obesity, unspecified: Secondary | ICD-10-CM | POA: Diagnosis not present

## 2022-04-01 DIAGNOSIS — Z79899 Other long term (current) drug therapy: Secondary | ICD-10-CM | POA: Diagnosis not present

## 2022-04-01 DIAGNOSIS — I10 Essential (primary) hypertension: Secondary | ICD-10-CM | POA: Diagnosis not present

## 2022-04-01 DIAGNOSIS — Z23 Encounter for immunization: Secondary | ICD-10-CM | POA: Diagnosis not present

## 2022-04-01 DIAGNOSIS — Z7901 Long term (current) use of anticoagulants: Secondary | ICD-10-CM | POA: Diagnosis not present

## 2022-04-01 DIAGNOSIS — I495 Sick sinus syndrome: Secondary | ICD-10-CM | POA: Diagnosis not present

## 2022-06-27 DIAGNOSIS — I10 Essential (primary) hypertension: Secondary | ICD-10-CM | POA: Diagnosis not present

## 2022-06-27 DIAGNOSIS — I480 Paroxysmal atrial fibrillation: Secondary | ICD-10-CM | POA: Diagnosis not present

## 2022-06-27 DIAGNOSIS — I2510 Atherosclerotic heart disease of native coronary artery without angina pectoris: Secondary | ICD-10-CM | POA: Diagnosis not present

## 2022-06-27 DIAGNOSIS — E782 Mixed hyperlipidemia: Secondary | ICD-10-CM | POA: Diagnosis not present

## 2022-09-30 DIAGNOSIS — I10 Essential (primary) hypertension: Secondary | ICD-10-CM | POA: Diagnosis not present

## 2022-09-30 DIAGNOSIS — I480 Paroxysmal atrial fibrillation: Secondary | ICD-10-CM | POA: Diagnosis not present

## 2022-09-30 DIAGNOSIS — E782 Mixed hyperlipidemia: Secondary | ICD-10-CM | POA: Diagnosis not present

## 2022-09-30 DIAGNOSIS — I495 Sick sinus syndrome: Secondary | ICD-10-CM | POA: Diagnosis not present

## 2022-09-30 DIAGNOSIS — Z0000 Encounter for general adult medical examination without abnormal findings: Secondary | ICD-10-CM | POA: Diagnosis not present

## 2022-09-30 DIAGNOSIS — I2510 Atherosclerotic heart disease of native coronary artery without angina pectoris: Secondary | ICD-10-CM | POA: Diagnosis not present

## 2022-09-30 DIAGNOSIS — Z7901 Long term (current) use of anticoagulants: Secondary | ICD-10-CM | POA: Diagnosis not present

## 2022-09-30 DIAGNOSIS — E669 Obesity, unspecified: Secondary | ICD-10-CM | POA: Diagnosis not present

## 2022-09-30 DIAGNOSIS — Z79899 Other long term (current) drug therapy: Secondary | ICD-10-CM | POA: Diagnosis not present

## 2022-12-26 DIAGNOSIS — Z79899 Other long term (current) drug therapy: Secondary | ICD-10-CM | POA: Diagnosis not present

## 2022-12-26 DIAGNOSIS — Z5181 Encounter for therapeutic drug level monitoring: Secondary | ICD-10-CM | POA: Diagnosis not present

## 2022-12-26 DIAGNOSIS — I2510 Atherosclerotic heart disease of native coronary artery without angina pectoris: Secondary | ICD-10-CM | POA: Diagnosis not present

## 2022-12-26 DIAGNOSIS — I10 Essential (primary) hypertension: Secondary | ICD-10-CM | POA: Diagnosis not present

## 2022-12-26 DIAGNOSIS — E782 Mixed hyperlipidemia: Secondary | ICD-10-CM | POA: Diagnosis not present

## 2022-12-26 DIAGNOSIS — I480 Paroxysmal atrial fibrillation: Secondary | ICD-10-CM | POA: Diagnosis not present

## 2023-01-02 DIAGNOSIS — I480 Paroxysmal atrial fibrillation: Secondary | ICD-10-CM | POA: Diagnosis not present

## 2023-01-02 DIAGNOSIS — I2510 Atherosclerotic heart disease of native coronary artery without angina pectoris: Secondary | ICD-10-CM | POA: Diagnosis not present

## 2023-04-05 DIAGNOSIS — E66811 Obesity, class 1: Secondary | ICD-10-CM | POA: Diagnosis not present

## 2023-04-05 DIAGNOSIS — I480 Paroxysmal atrial fibrillation: Secondary | ICD-10-CM | POA: Diagnosis not present

## 2023-04-05 DIAGNOSIS — I10 Essential (primary) hypertension: Secondary | ICD-10-CM | POA: Diagnosis not present

## 2023-04-05 DIAGNOSIS — I495 Sick sinus syndrome: Secondary | ICD-10-CM | POA: Diagnosis not present

## 2023-04-05 DIAGNOSIS — Z1331 Encounter for screening for depression: Secondary | ICD-10-CM | POA: Diagnosis not present

## 2023-04-05 DIAGNOSIS — Z79899 Other long term (current) drug therapy: Secondary | ICD-10-CM | POA: Diagnosis not present

## 2023-04-05 DIAGNOSIS — Z23 Encounter for immunization: Secondary | ICD-10-CM | POA: Diagnosis not present

## 2023-04-05 DIAGNOSIS — E782 Mixed hyperlipidemia: Secondary | ICD-10-CM | POA: Diagnosis not present

## 2023-04-05 DIAGNOSIS — I2510 Atherosclerotic heart disease of native coronary artery without angina pectoris: Secondary | ICD-10-CM | POA: Diagnosis not present

## 2023-06-28 DIAGNOSIS — I2510 Atherosclerotic heart disease of native coronary artery without angina pectoris: Secondary | ICD-10-CM | POA: Diagnosis not present

## 2023-06-28 DIAGNOSIS — I480 Paroxysmal atrial fibrillation: Secondary | ICD-10-CM | POA: Diagnosis not present

## 2023-06-28 DIAGNOSIS — Z5181 Encounter for therapeutic drug level monitoring: Secondary | ICD-10-CM | POA: Diagnosis not present

## 2023-06-28 DIAGNOSIS — Z79899 Other long term (current) drug therapy: Secondary | ICD-10-CM | POA: Diagnosis not present

## 2023-06-28 DIAGNOSIS — E782 Mixed hyperlipidemia: Secondary | ICD-10-CM | POA: Diagnosis not present

## 2023-06-28 DIAGNOSIS — R040 Epistaxis: Secondary | ICD-10-CM | POA: Diagnosis not present

## 2023-10-05 DIAGNOSIS — E66811 Obesity, class 1: Secondary | ICD-10-CM | POA: Diagnosis not present

## 2023-10-05 DIAGNOSIS — E782 Mixed hyperlipidemia: Secondary | ICD-10-CM | POA: Diagnosis not present

## 2023-10-05 DIAGNOSIS — I2510 Atherosclerotic heart disease of native coronary artery without angina pectoris: Secondary | ICD-10-CM | POA: Diagnosis not present

## 2023-10-05 DIAGNOSIS — I10 Essential (primary) hypertension: Secondary | ICD-10-CM | POA: Diagnosis not present

## 2023-10-05 DIAGNOSIS — I480 Paroxysmal atrial fibrillation: Secondary | ICD-10-CM | POA: Diagnosis not present

## 2023-10-05 DIAGNOSIS — Z0000 Encounter for general adult medical examination without abnormal findings: Secondary | ICD-10-CM | POA: Diagnosis not present

## 2023-10-05 DIAGNOSIS — L989 Disorder of the skin and subcutaneous tissue, unspecified: Secondary | ICD-10-CM | POA: Diagnosis not present

## 2023-10-05 DIAGNOSIS — Z125 Encounter for screening for malignant neoplasm of prostate: Secondary | ICD-10-CM | POA: Diagnosis not present

## 2023-10-05 DIAGNOSIS — I495 Sick sinus syndrome: Secondary | ICD-10-CM | POA: Diagnosis not present

## 2023-10-05 DIAGNOSIS — Z79899 Other long term (current) drug therapy: Secondary | ICD-10-CM | POA: Diagnosis not present

## 2023-12-26 DIAGNOSIS — I480 Paroxysmal atrial fibrillation: Secondary | ICD-10-CM | POA: Diagnosis not present

## 2023-12-26 DIAGNOSIS — I10 Essential (primary) hypertension: Secondary | ICD-10-CM | POA: Diagnosis not present

## 2023-12-26 DIAGNOSIS — I2510 Atherosclerotic heart disease of native coronary artery without angina pectoris: Secondary | ICD-10-CM | POA: Diagnosis not present

## 2023-12-26 DIAGNOSIS — E782 Mixed hyperlipidemia: Secondary | ICD-10-CM | POA: Diagnosis not present

## 2024-04-08 DIAGNOSIS — Z79899 Other long term (current) drug therapy: Secondary | ICD-10-CM | POA: Diagnosis not present

## 2024-04-08 DIAGNOSIS — I480 Paroxysmal atrial fibrillation: Secondary | ICD-10-CM | POA: Diagnosis not present

## 2024-04-08 DIAGNOSIS — I10 Essential (primary) hypertension: Secondary | ICD-10-CM | POA: Diagnosis not present

## 2024-04-08 DIAGNOSIS — Z1331 Encounter for screening for depression: Secondary | ICD-10-CM | POA: Diagnosis not present

## 2024-04-08 DIAGNOSIS — E66811 Obesity, class 1: Secondary | ICD-10-CM | POA: Diagnosis not present

## 2024-04-08 DIAGNOSIS — I495 Sick sinus syndrome: Secondary | ICD-10-CM | POA: Diagnosis not present

## 2024-04-08 DIAGNOSIS — I2510 Atherosclerotic heart disease of native coronary artery without angina pectoris: Secondary | ICD-10-CM | POA: Diagnosis not present

## 2024-04-08 DIAGNOSIS — E782 Mixed hyperlipidemia: Secondary | ICD-10-CM | POA: Diagnosis not present
